# Patient Record
Sex: Female | Born: 1965 | Race: Black or African American | Hispanic: No | Marital: Married | Smoking: Current every day smoker
Health system: Southern US, Community
[De-identification: ages and names within clinical notes are randomized; demographics above are authoritative.]

## PROBLEM LIST (undated history)

## (undated) DIAGNOSIS — Z9181 History of falling: Secondary | ICD-10-CM

## (undated) DIAGNOSIS — F17210 Nicotine dependence, cigarettes, uncomplicated: Secondary | ICD-10-CM

## (undated) DIAGNOSIS — E785 Hyperlipidemia, unspecified: Secondary | ICD-10-CM

## (undated) DIAGNOSIS — M25561 Pain in right knee: Secondary | ICD-10-CM

## (undated) DIAGNOSIS — E119 Type 2 diabetes mellitus without complications: Secondary | ICD-10-CM

## (undated) DIAGNOSIS — I2699 Other pulmonary embolism without acute cor pulmonale: Secondary | ICD-10-CM

## (undated) DIAGNOSIS — K219 Gastro-esophageal reflux disease without esophagitis: Secondary | ICD-10-CM

## (undated) DIAGNOSIS — I152 Hypertension secondary to endocrine disorders: Secondary | ICD-10-CM

## (undated) DIAGNOSIS — I10 Essential (primary) hypertension: Secondary | ICD-10-CM

## (undated) DIAGNOSIS — Z720 Tobacco use: Secondary | ICD-10-CM

## (undated) DIAGNOSIS — E1159 Type 2 diabetes mellitus with other circulatory complications: Secondary | ICD-10-CM

## (undated) HISTORY — DX: Nicotine dependence, cigarettes, uncomplicated: F17.210

## (undated) HISTORY — DX: History of falling: Z91.81

## (undated) HISTORY — DX: Gastro-esophageal reflux disease without esophagitis: K21.9

## (undated) HISTORY — DX: Hypertension secondary to endocrine disorders: I15.2

## (undated) HISTORY — DX: Hyperlipidemia, unspecified: E78.5

## (undated) HISTORY — DX: Type 2 diabetes mellitus with other circulatory complications: E11.59

## (undated) HISTORY — DX: Type 2 diabetes mellitus without complications: E11.9

## (undated) HISTORY — DX: Tobacco use: Z72.0

## (undated) HISTORY — DX: Other pulmonary embolism without acute cor pulmonale: I26.99

## (undated) HISTORY — DX: Pain in right knee: M25.561

## (undated) HISTORY — DX: Essential (primary) hypertension: I10

---

## 2014-11-23 ENCOUNTER — Other Ambulatory Visit: Payer: Self-pay | Admitting: Family Medicine

## 2014-11-23 DIAGNOSIS — R928 Other abnormal and inconclusive findings on diagnostic imaging of breast: Secondary | ICD-10-CM

## 2014-11-29 ENCOUNTER — Other Ambulatory Visit: Payer: Self-pay

## 2014-12-05 ENCOUNTER — Inpatient Hospital Stay: Admission: RE | Admit: 2014-12-05 | Payer: Self-pay | Source: Ambulatory Visit

## 2015-04-12 ENCOUNTER — Other Ambulatory Visit: Payer: Self-pay | Admitting: Family Medicine

## 2015-04-12 ENCOUNTER — Ambulatory Visit
Admission: RE | Admit: 2015-04-12 | Discharge: 2015-04-12 | Disposition: A | Discharge status: Home / Self Care | Payer: BLUE CROSS/BLUE SHIELD | Source: Ambulatory Visit | Attending: Family Medicine | Admitting: Family Medicine

## 2015-04-12 DIAGNOSIS — R928 Other abnormal and inconclusive findings on diagnostic imaging of breast: Secondary | ICD-10-CM

## 2016-09-22 ENCOUNTER — Encounter (INDEPENDENT_AMBULATORY_CARE_PROVIDER_SITE_OTHER): Payer: Self-pay | Admitting: *Deleted

## 2020-04-25 ENCOUNTER — Other Ambulatory Visit: Payer: Self-pay

## 2020-04-25 ENCOUNTER — Encounter: Payer: Self-pay | Admitting: Orthopaedic Surgery

## 2020-04-25 ENCOUNTER — Ambulatory Visit: Payer: 59 | Admitting: Orthopaedic Surgery

## 2020-04-25 VITALS — BP 211/117 | HR 120 | Wt 163.0 lb

## 2020-04-25 DIAGNOSIS — G8929 Other chronic pain: Secondary | ICD-10-CM | POA: Diagnosis not present

## 2020-04-25 DIAGNOSIS — M25562 Pain in left knee: Secondary | ICD-10-CM

## 2020-04-25 NOTE — Progress Notes (Signed)
Subjective:    Patient ID: Anne May, female    DOB: May 30, 1966, 54 y.o.   MRN: [id number]  HPI She had an injury to her left knee about two to two and a half years ago when her son ran into her playing.  She has had medial knee pain since then.  She has swelling, popping and giving way.  She was seen at [facility] 01-02-2020 for the knee.  She has had x-rays and injection.  She was seen at [facility] on 02-02-2020 for her knee and had aspiration and injection as well as x-rays.  She was seen at [facility] in [place] on 03-19-2020 and evaluated for annual physical.  She was referred her from that visit.   She has pain and swelling of the left knee still.  Giving way and popping.  She has no new trauma.  She says the injections only helped a little.  She is tired of the knee hurting, giving way and nothing done to fix it.  I am concerned about a medial meniscus tear and possible need for arthroscopy.  She needs a MRI.  I will arrange this.  For Review of Systems, all other systems reviewed and are negative.  The following is a summary of the past history medically, past history surgically, known current medicines, social history and family history.  This information is gathered electronically by the computer from prior information and documentation.  I review this each visit and have found including this information at this point in the chart is beneficial and informative.   No past medical history on file.    Current Outpatient Medications on File Prior to Visit  Medication Sig Dispense Refill  . atorvastatin (LIPITOR) 20 MG tablet Take 20 mg by mouth daily.    . diphenhydrAMINE (BENADRYL) 25 mg capsule Take by mouth.    . famotidine (PEPCID) 20 MG tablet Take by mouth.    . hydrochlorothiazide (HYDRODIURIL) 25 MG tablet Take 25 mg by mouth daily.    Marland Kitchen ibuprofen (ADVIL) 800 MG tablet Take 800 mg by mouth every 8 (eight) hours as needed.    Marland Kitchen lisinopril (ZESTRIL) 40 MG tablet  Take 40 mg by mouth daily.    . metFORMIN (GLUCOPHAGE) 1000 MG tablet Take 1,000 mg by mouth 2 (two) times daily.     No current facility-administered medications on file prior to visit.    Social History   Socioeconomic History  . Marital status: Married    Spouse name: Not on file  . Number of children: Not on file  . Years of education: Not on file  . Highest education level: Not on file  Occupational History  . Not on file  Tobacco Use  . Smoking status: Current Every Day Smoker  . Smokeless tobacco: Never Used  Substance and Sexual Activity  . Alcohol use: Not Currently  . Drug use: Never  . Sexual activity: Not on file  Other Topics Concern  . Not on file  Social History Narrative  . Not on file   Social Determinants of Health   Financial Resource Strain:   . Difficulty of Paying Living Expenses: Not on file  Food Insecurity:   . Worried About Programme researcher, broadcasting/film/video in the Last Year: Not on file  . Ran Out of Food in the Last Year: Not on file  Transportation Needs:   . Lack of Transportation (Medical): Not on file  . Lack of Transportation (Non-Medical): Not on file  Physical Activity:   . Days of Exercise per Week: Not on file  . Minutes of Exercise per Session: Not on file  Stress:   . Feeling of Stress : Not on file  Social Connections:   . Frequency of Communication with Friends and Family: Not on file  . Frequency of Social Gatherings with Friends and Family: Not on file  . Attends Religious Services: Not on file  . Active Member of Clubs or Organizations: Not on file  . Attends Banker Meetings: Not on file  . Marital Status: Not on file  Intimate Partner Violence:   . Fear of Current or Ex-Partner: Not on file  . Emotionally Abused: Not on file  . Physically Abused: Not on file  . Sexually Abused: Not on file    Family history of heart disease.  BP (!) 211/117   Pulse (!) 120   Wt 163 lb (73.9 kg)   There is no height or weight on  file to calculate BMI.   Review of Systems     Objective:   Physical Exam Vitals and nursing note reviewed.  Constitutional:      Appearance: She is well-developed.  HENT:     Head: Normocephalic and atraumatic.  Eyes:     Conjunctiva/sclera: Conjunctivae normal.     Pupils: Pupils are equal, round, and reactive to light.  Cardiovascular:     Rate and Rhythm: Normal rate and regular rhythm.  Pulmonary:     Effort: Pulmonary effort is normal.  Abdominal:     Palpations: Abdomen is soft.  Musculoskeletal:     Cervical back: Normal range of motion and neck supple.       Legs:  Skin:    General: Skin is warm and dry.  Neurological:     Mental Status: She is alert and oriented to person, place, and time.     Cranial Nerves: No cranial nerve deficit.     Motor: No abnormal muscle tone.     Coordination: Coordination normal.     Deep Tendon Reflexes: Reflexes are normal and symmetric. Reflexes normal.  Psychiatric:        Behavior: Behavior normal.        Thought Content: Thought content normal.        Judgment: Judgment normal.    I have reviewed prior notes mentioned above.  I do not have the actual x-rays to review today but the reports are present.      Assessment & Plan:   Encounter Diagnosis  Name Primary?  . Chronic pain of left knee Yes   I will get MRI of the left knee.  Return after MRI.  Call if any problem.  Precautions discussed.   Electronically Signed Darreld Mclean, MD 9/8/20218:54 AM

## 2020-04-26 ENCOUNTER — Ambulatory Visit: Payer: 59 | Admitting: Orthopaedic Surgery

## 2020-04-27 ENCOUNTER — Telehealth: Payer: Self-pay | Admitting: Orthopaedic Surgery

## 2020-04-27 NOTE — Telephone Encounter (Signed)
Called patient to R/S her appt with Dr. Hilda Lias on 05/08/20.  Her office visit is before the MRI test on 05/09/20.    Need to R/S her office visit if she calls back

## 2020-05-07 NOTE — Telephone Encounter (Signed)
Patient has rescheduled her appointment for a later date.

## 2020-05-08 ENCOUNTER — Ambulatory Visit: Payer: 59 | Admitting: Orthopaedic Surgery

## 2020-05-09 ENCOUNTER — Other Ambulatory Visit: Payer: Self-pay

## 2020-05-09 ENCOUNTER — Ambulatory Visit (HOSPITAL_COMMUNITY)
Admission: RE | Admit: 2020-05-09 | Discharge: 2020-05-09 | Disposition: A | Discharge status: Home / Self Care | Payer: 59 | Source: Ambulatory Visit | Attending: Orthopaedic Surgery | Admitting: Orthopaedic Surgery

## 2020-05-09 DIAGNOSIS — M25562 Pain in left knee: Secondary | ICD-10-CM | POA: Diagnosis present

## 2020-05-09 DIAGNOSIS — G8929 Other chronic pain: Secondary | ICD-10-CM

## 2020-05-09 IMAGING — MR MR KNEE*L* W/O CM
7 series · 40 of 40 positions shown · non-contrast
Comparison: Radiographs [DATE]

CLINICAL DATA: Chronic left knee pain for 6 months. No known
injury.

EXAM:
MRI OF THE LEFT KNEE WITHOUT CONTRAST
TECHNIQUE: Multiplanar, multisequence MR imaging of the knee was performed. No
intravenous contrast was administered.

[Series 8: T2 fat-sat · axial · left · 4.0mm · 0.47mm/px · z∈[-60,+65]mm · 7 of 26 slices shown (1 of 3)]
[im 1/26]
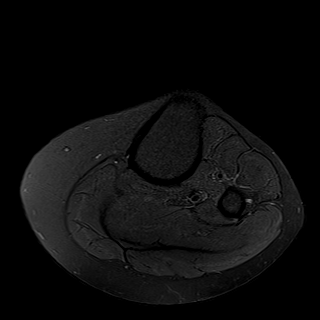
[im 5/26]
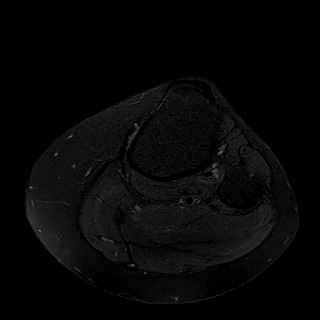
[im 9/26]
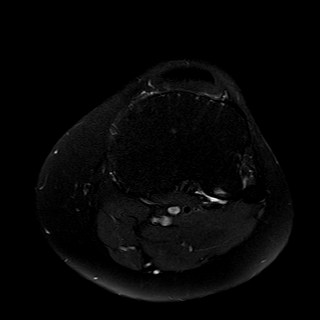
[im 13/26]
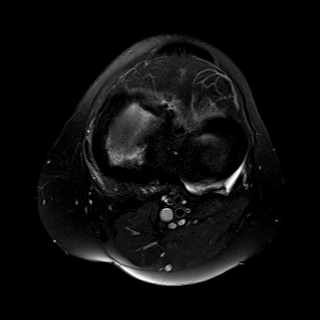
[im 17/26]
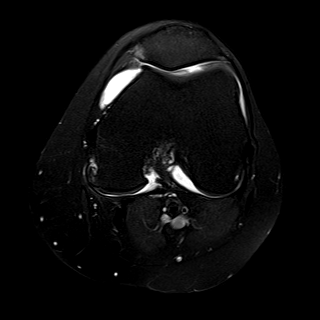
[im 21/26]
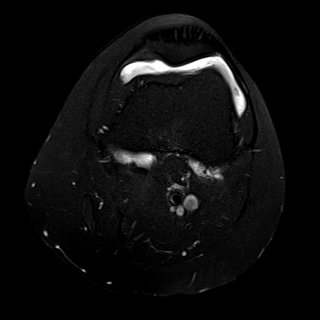
[im 26/26]
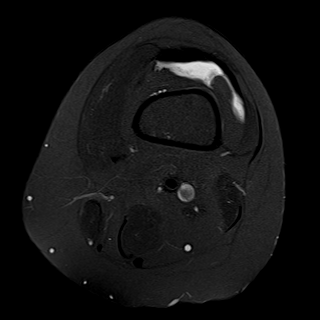

[Series 9: T1 · coronal · left · 4.0mm · 0.59mm/px · 5 of 24 slices shown]
[im 1/24]
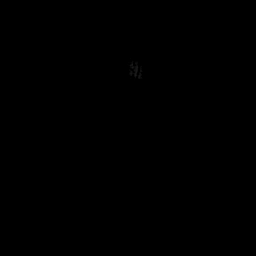
[im 6/24]
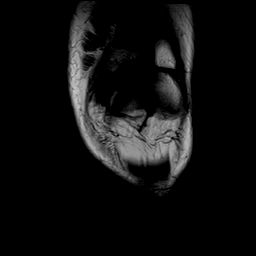
[im 12/24]
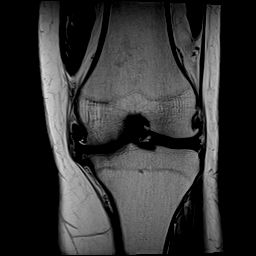
[im 18/24]
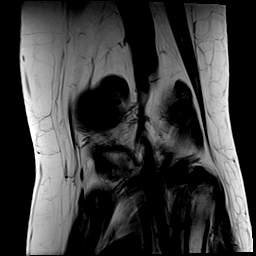
[im 24/24]
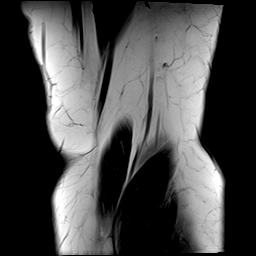

[Series 10: T2 fat-sat · coronal · left · 4.0mm · 0.59mm/px · 5 of 24 slices shown (2 of 3)]
[im 1/24]
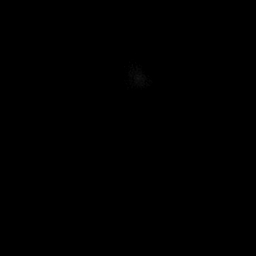
[im 6/24]
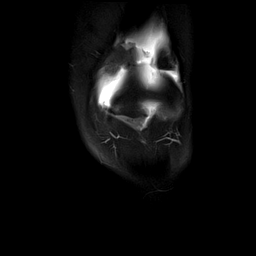
[im 12/24]
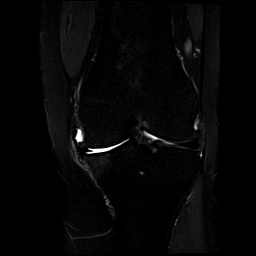
[im 18/24]
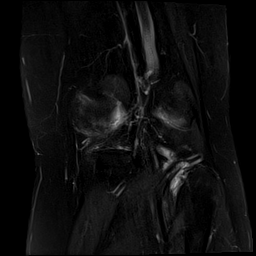
[im 24/24]
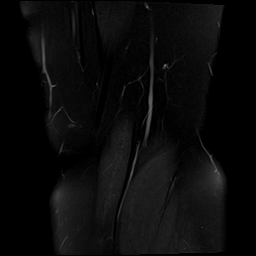

[Series 11: PD fat-sat · coronal · left · 3.0mm · 0.59mm/px · 7 of 30 slices shown (1 of 2)]
[im 1/30]
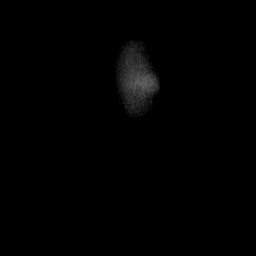
[im 5/30]
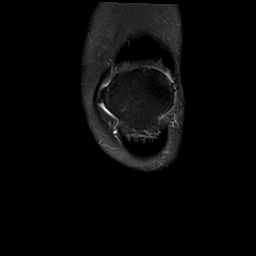
[im 10/30]
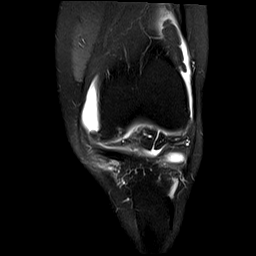
[im 15/30]
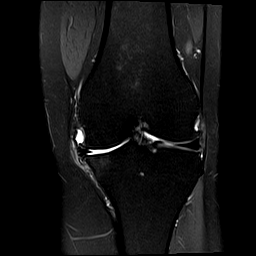
[im 20/30]
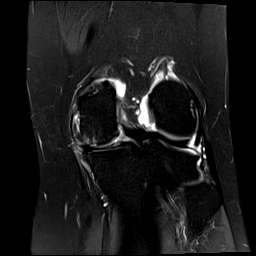
[im 25/30]
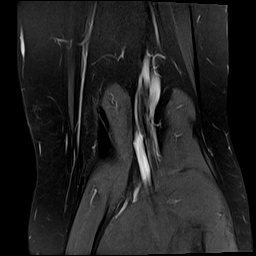
[im 30/30]
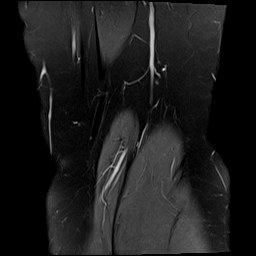

[Series 12: PD fat-sat · sagittal · left · 3.0mm · 0.59mm/px · 6 of 28 slices shown (2 of 2)]
[im 1/28]
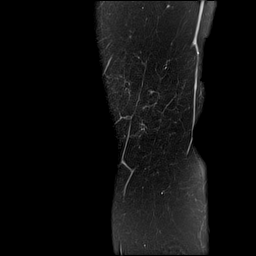
[im 6/28]
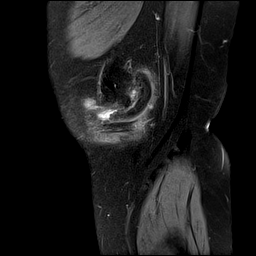
[im 11/28]
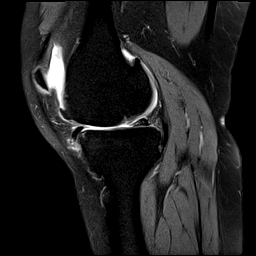
[im 17/28]
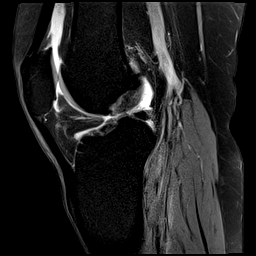
[im 22/28]
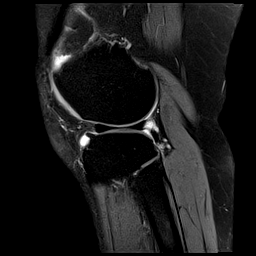
[im 28/28]
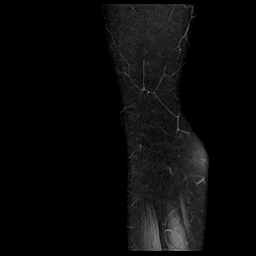

[Series 13: T2 fat-sat · sagittal · left · 3.0mm · 0.59mm/px · 6 of 28 slices shown (3 of 3)]
[im 1/28]
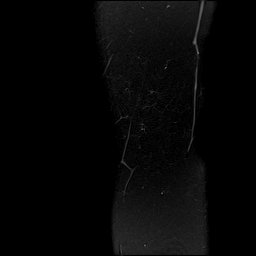
[im 6/28]
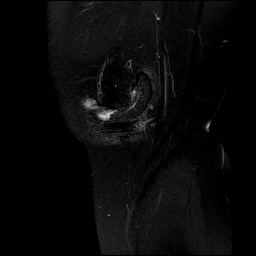
[im 11/28]
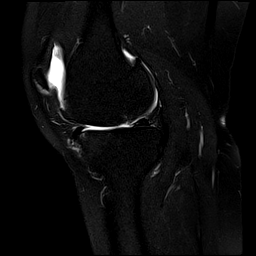
[im 17/28]
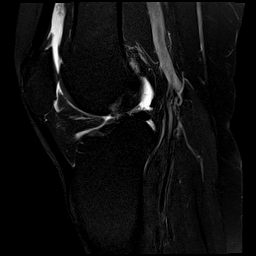
[im 22/28]
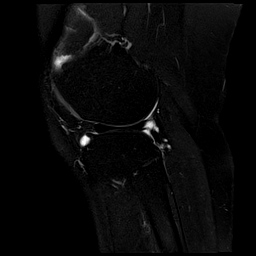
[im 28/28]
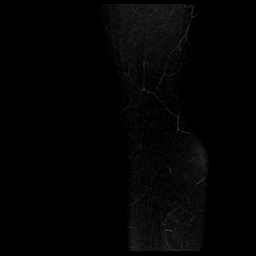

[Series 14: PD · coronal · left · 2.0mm · 0.47mm/px · 4 of 20 slices shown]
[im 1/20]
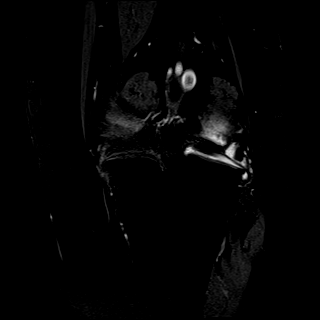
[im 7/20]
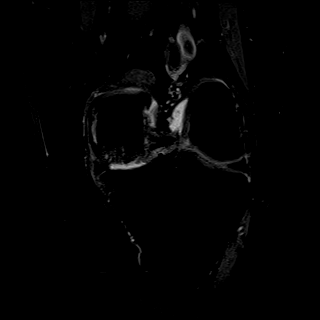
[im 13/20]
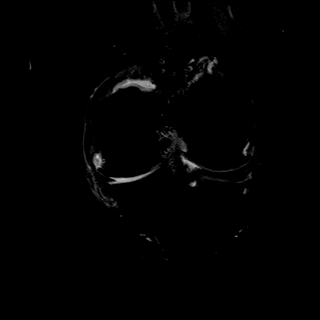
[im 20/20]
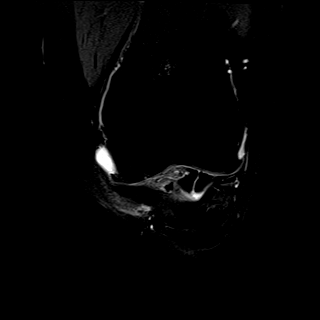

[40 of 40 positions shown; findings below may reference images not displayed]

FINDINGS: MENISCI

Medial meniscus: Diffusely degenerated with mild free edge fraying
and mild peripheral extrusion from the joint. The meniscal root is
intact. No discrete meniscal tear or displaced meniscal fragment.

Lateral meniscus:  Intact with normal morphology.

LIGAMENTS

Cruciates:  Intact.

Collaterals: Intact. Degenerative buckling of the medial collateral
ligament.

CARTILAGE

Patellofemoral: Mild diffuse chondral thinning and osteophyte
formation. Small subchondral cysts in the trochlea.

Medial: Advanced osteoarthritis with diffuse chondral thinning,
osteophytes and subchondral eburnation.

Lateral: Mild chondral thinning and peripheral osteophyte formation.

MISCELLANEOUS

Joint: Moderate-sized joint effusion. No evidence of intra-articular
loose body.

Popliteal Fossa:  Unremarkable. No significant Baker's cyst.

Extensor Mechanism:  Intact.

Bones:  No acute or significant extra-articular osseous findings.

Other: No other significant periarticular soft tissue findings.
IMPRESSION: 1. Tricompartmental osteoarthritis, most advanced in the medial
compartment. No acute osseous findings.
2. Moderate-sized joint effusion.
3. Degeneration and free edge fraying of the medial meniscus without
discrete tear.
4. The lateral meniscus, cruciate and collateral ligaments are
intact.

## 2020-05-10 ENCOUNTER — Encounter: Payer: Self-pay | Admitting: Orthopaedic Surgery

## 2020-05-10 ENCOUNTER — Telehealth: Payer: Self-pay | Admitting: Orthopaedic Surgery

## 2020-05-10 ENCOUNTER — Ambulatory Visit: Payer: 59 | Admitting: Orthopaedic Surgery

## 2020-05-10 VITALS — Ht 66.5 in | Wt 179.0 lb

## 2020-05-10 DIAGNOSIS — M25562 Pain in left knee: Secondary | ICD-10-CM | POA: Diagnosis not present

## 2020-05-10 DIAGNOSIS — G8929 Other chronic pain: Secondary | ICD-10-CM | POA: Diagnosis not present

## 2020-05-10 NOTE — Telephone Encounter (Signed)
Please call and verify left knee injections for Visco supplementation.

## 2020-05-10 NOTE — Progress Notes (Signed)
What did the MRI show?  She had the MRI of the left knee.  It showed: IMPRESSION: 1. 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 No acute osseous findings. 2. Moderate-sized joint effusion. 3. Degeneration and free edge fraying of the medial meniscus without discrete tear. 4. The lateral meniscus, cruciate and collateral ligaments are Intact.  I explained the findings to her.  I have independently reviewed the MRI.     She became very emotional.  I told her that she did not need surgery.  She has had problems with the knee for about three years.  She has had injections, medicine, exercises and still hurts.  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  I spent over 30 minutes with her answering her questions.  I will arrange for viscosupplementation for the knee.  Return in two weeks.  Call if any problem.  Precautions discussed.   Electronically Signed Darreld Mclean, MD 9/23/202111:18 AM

## 2020-05-11 NOTE — Telephone Encounter (Signed)
[employer] ins can do Orthovisc, Synvisc or Synvisc One.  Orthovisc and Synvisc are multiple injections (3) and Synvisc One is the one shot.   Let me know which patient would rather try and I can check benefits and do the auth for it.

## 2020-05-11 NOTE — Telephone Encounter (Signed)
Can you verify which visco supplementation is covered by this insurance? Please advise.

## 2020-05-14 NOTE — Telephone Encounter (Signed)
Patient wants to try for the Orthovisc. If that is not covered then she will try for the Synvisc. Please advise.

## 2020-05-15 NOTE — Telephone Encounter (Signed)
Orthovisc authorized, auth # [id number], valid 05/14/2020 thru 05/13/2021.  Insurance will cover at 100% since deductible is met.    Ok to call patient and schedule her for Orthovisc injections, left knee, 3 visits one week apart.  Angie, can you please call and schedule?    Thanks-

## 2020-05-16 NOTE — Telephone Encounter (Signed)
Patient has an appointment scheduled.  °

## 2020-05-24 ENCOUNTER — Other Ambulatory Visit: Payer: Self-pay

## 2020-05-24 ENCOUNTER — Encounter: Payer: Self-pay | Admitting: Orthopaedic Surgery

## 2020-05-24 ENCOUNTER — Ambulatory Visit (INDEPENDENT_AMBULATORY_CARE_PROVIDER_SITE_OTHER): Payer: 59 | Admitting: Orthopaedic Surgery

## 2020-05-24 DIAGNOSIS — G8929 Other chronic pain: Secondary | ICD-10-CM

## 2020-05-24 DIAGNOSIS — M1712 Unilateral primary osteoarthritis, left knee: Secondary | ICD-10-CM | POA: Diagnosis not present

## 2020-05-24 NOTE — Progress Notes (Addendum)
This patient is diagnosed with osteoarthritis of the knee(s).    Radiographs show evidence of joint space narrowing, osteophytes, subchondral sclerosis and/or subchondral cysts.  This patient has knee pain which interferes with functional and activities of daily living.    This patient has experienced inadequate response, adverse effects and/or intolerance with conservative treatments such as acetaminophen, NSAIDS, topical creams, physical therapy or regular exercise, knee bracing and/or weight loss.   This patient has experienced inadequate response or has a contraindication to intra articular steroid injections for at least 3 months.   This patient is not scheduled to have a total knee replacement within 6 months of starting treatment with viscosupplementation.  PROCEDURE NOTE:  Orthovisc injection #  1 of 3   Injection 1 vial of Orthovisc into left  knee  There were no vitals taken for this visit.  Thel eft knee exam: there was no synovitis or infection   The knee was prepped sterilely  Ethyl chloride was used to anesthetize the skin A 20 g needle was used to inject the knee with 1 vial of Orthovisc A sterile dressing was placed  There were no complications  Encounter Diagnosis  Name Primary?   Chronic pain of left knee Yes    Follow up one week  Electronically Signed Darreld Mclean, MD 10/7/20219:32 AM

## 2020-05-31 ENCOUNTER — Ambulatory Visit (INDEPENDENT_AMBULATORY_CARE_PROVIDER_SITE_OTHER): Payer: 59 | Admitting: Orthopaedic Surgery

## 2020-05-31 ENCOUNTER — Encounter: Payer: Self-pay | Admitting: Orthopaedic Surgery

## 2020-05-31 ENCOUNTER — Other Ambulatory Visit: Payer: Self-pay

## 2020-05-31 VITALS — Ht 66.5 in | Wt 179.0 lb

## 2020-05-31 DIAGNOSIS — G8929 Other chronic pain: Secondary | ICD-10-CM

## 2020-05-31 DIAGNOSIS — M1712 Unilateral primary osteoarthritis, left knee: Secondary | ICD-10-CM

## 2020-05-31 NOTE — Progress Notes (Signed)
This patient is diagnosed with osteoarthritis of the knee(s).    Radiographs show evidence of joint space narrowing, osteophytes, subchondral sclerosis and/or subchondral cysts.  This patient has knee pain which interferes with functional and activities of daily living.    This patient has experienced inadequate response, adverse effects and/or intolerance with conservative treatments such as acetaminophen, NSAIDS, topical creams, physical therapy or regular exercise, knee bracing and/or weight loss.   This patient has experienced inadequate response or has a contraindication to intra articular steroid injections for at least 3 months.   This patient is not scheduled to have a total knee replacement within 6 months of starting treatment with viscosupplementation.  PROCEDURE NOTE:  Orthovisc injection #  2 of 3   Injection 1 vial of Orthovisc into left  knee  Ht 5' 6.5" (1.689 m)   Wt 179 lb (81.2 kg)   BMI 28.46 kg/m   The left knee exam: there was no synovitis or infection   The knee was prepped sterilely  Ethyl chloride was used to anesthetize the skin A 20 g needle was used to inject the knee with 1 vial of Orthovisc A sterile dressing was placed  There were no complications  Encounter Diagnosis  Name Primary?  . Chronic pain of left knee Yes     Follow up one week  Electronically Signed Darreld Mclean, MD 10/14/20219:18 AM

## 2020-06-07 ENCOUNTER — Ambulatory Visit (INDEPENDENT_AMBULATORY_CARE_PROVIDER_SITE_OTHER): Payer: 59 | Admitting: Orthopaedic Surgery

## 2020-06-07 ENCOUNTER — Other Ambulatory Visit: Payer: Self-pay

## 2020-06-07 ENCOUNTER — Encounter: Payer: Self-pay | Admitting: Orthopaedic Surgery

## 2020-06-07 DIAGNOSIS — M1712 Unilateral primary osteoarthritis, left knee: Secondary | ICD-10-CM | POA: Diagnosis not present

## 2020-06-07 DIAGNOSIS — G8929 Other chronic pain: Secondary | ICD-10-CM

## 2020-06-07 DIAGNOSIS — M25562 Pain in left knee: Secondary | ICD-10-CM

## 2020-06-07 NOTE — Progress Notes (Signed)
This patient is diagnosed with osteoarthritis of the knee(s).    Radiographs show evidence of joint space narrowing, osteophytes, subchondral sclerosis and/or subchondral cysts.  This patient has knee pain which interferes with functional and activities of daily living.    This patient has experienced inadequate response, adverse effects and/or intolerance with conservative treatments such as acetaminophen, NSAIDS, topical creams, physical therapy or regular exercise, knee bracing and/or weight loss.   This patient has experienced inadequate response or has a contraindication to intra articular steroid injections for at least 3 months.   This patient is not scheduled to have a total knee replacement within 6 months of starting treatment with viscosupplementation.  PROCEDURE NOTE:  Orthovisc injection #  3 of 3   Injection 1 vial of Orthovisc into left  knee  There were no vitals taken for this visit.  The left knee exam: there was no synovitis or infection   The knee was prepped sterilely  Ethyl chloride was used to anesthetize the skin A 20 g needle was used to inject the knee with 1 vial of Orthovisc A sterile dressing was placed  There were no complications  Encounter Diagnosis  Name Primary?  . Chronic pain of left knee Yes    Follow up one month  Electronically Signed Darreld Mclean, MD 10/21/20211:38 PM

## 2020-07-05 ENCOUNTER — Other Ambulatory Visit: Payer: Self-pay

## 2020-07-05 ENCOUNTER — Ambulatory Visit: Payer: 59 | Admitting: Orthopaedic Surgery

## 2020-07-05 ENCOUNTER — Encounter: Payer: Self-pay | Admitting: Orthopaedic Surgery

## 2020-07-05 VITALS — BP 167/86 | HR 116 | Ht 66.5 in | Wt 172.0 lb

## 2020-07-05 DIAGNOSIS — G8929 Other chronic pain: Secondary | ICD-10-CM | POA: Diagnosis not present

## 2020-07-05 DIAGNOSIS — E1065 Type 1 diabetes mellitus with hyperglycemia: Secondary | ICD-10-CM | POA: Diagnosis not present

## 2020-07-05 DIAGNOSIS — M25562 Pain in left knee: Secondary | ICD-10-CM | POA: Diagnosis not present

## 2020-07-05 NOTE — Progress Notes (Signed)
She has left knee pain. She has had more pain.  She went to [facility] Saturday with blood sugars over 600.  A1C was over 15.  She was treated with IV and insulin. Her sugars were about 230 when she left.  She is not managing her diabetes well at all.  I have spent 30 minutes talking to her and her husband about this.  She needs to see her family doctor, needs to see her ophthalmology doctor, needs to see nutritionist, etc.  Her knee does hurt and she has significant DJD but no surgery can be done if her diabetes is not in control.  I have emphasized to her and her husband how dangerous this sugar level is and that she needs to focus all her energy on controlling it.  Thanksgiving is next week and she is obviously not eating well.  She is to go to her family doctor's office after leaving here.  I will see her in one month.  Encounter Diagnoses  Name Primary?  . Chronic pain of left knee Yes  . Uncontrolled type 1 diabetes mellitus with hyperglycemia (HCC)    I have again stressed the urgency of controlling her blood sugars.  Call if any problem.  Precautions discussed.   Electronically Signed Darreld Mclean, MD 11/18/20219:21 AM

## 2020-07-05 NOTE — Patient Instructions (Addendum)

## 2020-07-31 ENCOUNTER — Ambulatory Visit: Payer: 59 | Admitting: Orthopaedic Surgery

## 2020-08-02 ENCOUNTER — Ambulatory Visit: Payer: 59 | Admitting: Orthopaedic Surgery

## 2020-08-30 ENCOUNTER — Ambulatory Visit: Payer: 59 | Admitting: Orthopaedic Surgery

## 2020-10-09 ENCOUNTER — Encounter: Payer: Self-pay | Admitting: Orthopaedic Surgery

## 2020-10-09 ENCOUNTER — Ambulatory Visit: Payer: 59 | Admitting: Orthopaedic Surgery

## 2020-10-09 ENCOUNTER — Other Ambulatory Visit: Payer: Self-pay

## 2020-10-09 VITALS — BP 179/99 | HR 102 | Ht 65.5 in | Wt 179.0 lb

## 2020-10-09 DIAGNOSIS — E1065 Type 1 diabetes mellitus with hyperglycemia: Secondary | ICD-10-CM

## 2020-10-09 DIAGNOSIS — M25562 Pain in left knee: Secondary | ICD-10-CM | POA: Diagnosis not present

## 2020-10-09 DIAGNOSIS — G8929 Other chronic pain: Secondary | ICD-10-CM | POA: Diagnosis not present

## 2020-10-09 NOTE — Progress Notes (Signed)
I need a handicap sticker  She is not controlling her diabetes.  She is to see her primary care March 1.  She wants a handicap sticker.  Her left knee is tender, has slight effusion but she does not want injection or treatment today.  Encounter Diagnoses  Name Primary?  . Chronic pain of left knee Yes  . Uncontrolled type 1 diabetes mellitus with hyperglycemia (HCC)    Return in two weeks.  I have given signed application for handicap permit sticker.  Call if any problem.  Precautions discussed.   Electronically Signed Darreld Mclean, MD 2/22/20229:18 AM

## 2020-10-23 ENCOUNTER — Encounter: Payer: 59 | Admitting: Orthopaedic Surgery

## 2020-10-23 ENCOUNTER — Other Ambulatory Visit: Payer: Self-pay

## 2020-10-23 ENCOUNTER — Encounter: Payer: Self-pay | Admitting: Orthopaedic Surgery

## 2020-10-23 DIAGNOSIS — E785 Hyperlipidemia, unspecified: Secondary | ICD-10-CM | POA: Insufficient documentation

## 2020-10-23 DIAGNOSIS — E119 Type 2 diabetes mellitus without complications: Secondary | ICD-10-CM | POA: Insufficient documentation

## 2020-10-23 DIAGNOSIS — F17200 Nicotine dependence, unspecified, uncomplicated: Secondary | ICD-10-CM | POA: Insufficient documentation

## 2020-10-23 DIAGNOSIS — Z9181 History of falling: Secondary | ICD-10-CM | POA: Insufficient documentation

## 2020-10-23 DIAGNOSIS — K219 Gastro-esophageal reflux disease without esophagitis: Secondary | ICD-10-CM | POA: Insufficient documentation

## 2020-10-23 DIAGNOSIS — I10 Essential (primary) hypertension: Secondary | ICD-10-CM | POA: Insufficient documentation

## 2020-10-30 ENCOUNTER — Ambulatory Visit: Payer: 59 | Admitting: Orthopaedic Surgery

## 2020-10-30 ENCOUNTER — Other Ambulatory Visit: Payer: Self-pay

## 2020-10-30 ENCOUNTER — Ambulatory Visit: Payer: 59

## 2020-10-30 ENCOUNTER — Encounter: Payer: Self-pay | Admitting: Orthopaedic Surgery

## 2020-10-30 VITALS — BP 182/103 | HR 112 | Ht 65.5 in | Wt 174.0 lb

## 2020-10-30 DIAGNOSIS — G8929 Other chronic pain: Secondary | ICD-10-CM

## 2020-10-30 DIAGNOSIS — M25562 Pain in left knee: Secondary | ICD-10-CM

## 2020-10-30 NOTE — Progress Notes (Signed)
Patient Anne May, female DOB:Feb 19, 1966, 55 y.o. OFB:[id number]  Chief Complaint  Patient presents with  . Knee Pain    left    HPI  Anne May is a 55 y.o. female who has continued pain of the left knee with swelling and giving way.  She is not improved at all.  I have been seeing her since October 2021 for this problem  She has been seeing her diabetes doctor also but her diabetes is still uncontrolled by what I can find from talking to her.  She is working on it.  She does not know her last A1C or her last blood sugar.   Body mass index is 28.51 kg/m.  ROS  Review of Systems  Constitutional: Positive for activity change.  Endocrine:       Uncontrolled diabetes.  Musculoskeletal: Positive for arthralgias, gait problem and joint swelling.  All other systems reviewed and are negative.   All other systems reviewed and are negative.  The following is a summary of the past history medically, past history surgically, known current medicines, social history and family history.  This information is gathered electronically by the computer from prior information and documentation.  I review this each visit and have found including this information at this point in the chart is beneficial and informative.    History reviewed. No pertinent past medical history.  History reviewed. No pertinent surgical history.  Family History  Family history unknown: Yes    Social History Social History   Tobacco Use  . Smoking status: Current Every Day Smoker  . Smokeless tobacco: Never Used  Substance Use Topics  . Alcohol use: Not Currently  . Drug use: Never    No Known Allergies  Current Outpatient Medications  Medication Sig Dispense Refill  . amLODipine (NORVASC) 5 MG tablet Take by mouth.    Marland Kitchen atorvastatin (LIPITOR) 20 MG tablet Take 20 mg by mouth daily.    . diphenhydrAMINE (BENADRYL) 25 mg capsule Take by mouth.    . famotidine (PEPCID) 20 MG tablet Take by mouth.    Marland Kitchen  glipiZIDE (GLUCOTROL) 10 MG tablet Take 10 mg by mouth daily before breakfast.    . hydrochlorothiazide (HYDRODIURIL) 25 MG tablet Take 25 mg by mouth daily.    Marland Kitchen ibuprofen (ADVIL) 800 MG tablet Take 800 mg by mouth every 8 (eight) hours as needed.    Marland Kitchen lisinopril (ZESTRIL) 40 MG tablet Take 40 mg by mouth daily.    . metFORMIN (GLUCOPHAGE) 1000 MG tablet Take 1,000 mg by mouth 2 (two) times daily.     No current facility-administered medications for this visit.     Physical Exam  Blood pressure (!) 182/103, pulse (!) 112, height 5' 5.5" (1.664 m), weight 174 lb (78.9 kg).  Constitutional: overall normal hygiene, normal nutrition, well developed, normal grooming, normal body habitus. Assistive device:none  Musculoskeletal: gait and station Limp left, muscle tone and strength are normal, no tremors or atrophy is present.  .  Neurological: coordination overall normal.  Deep tendon reflex/nerve stretch intact.  Sensation normal.  Cranial nerves II-XII intact.   Skin:   Normal overall no scars, lesions, ulcers or rashes. No psoriasis.  Psychiatric: Alert and oriented x 3.  Recent memory intact, remote memory unclear.  Normal mood and affect. Well groomed.  Good eye contact.  Cardiovascular: overall no swelling, no varicosities, no edema bilaterally, normal temperatures of the legs and arms, no clubbing, cyanosis and good capillary refill.  Lymphatic: palpation is normal.  Left knee pain, effusion, ROM 0 to 105, tender medially, positive medial McMurray, limp left.  All other systems reviewed and are negative   The patient has been educated about the nature of the problem(s) and counseled on treatment options.  The patient appeared to understand what I have discussed and is in agreement with it.  Encounter Diagnosis  Name Primary?  . Chronic pain of left knee Yes  X-rays were done of the left knee, reported separately.  PLAN Call if any problems.  Precautions discussed.  Continue  current medications.   Return to clinic 2 weeks  Get MRI.   Electronically Signed Darreld Mclean, MD 3/15/202210:02 AM

## 2020-10-31 ENCOUNTER — Telehealth: Payer: Self-pay | Admitting: Orthopaedic Surgery

## 2020-10-31 NOTE — Telephone Encounter (Signed)
Patient called and left voicemail stating she almost fell and she needs a durable knee brace not the ones at [facility].  Please call her back.

## 2020-11-01 NOTE — Telephone Encounter (Signed)
Called and spoke patient. She is coming this afternoon around 2 to be fitted for brace.

## 2020-11-13 ENCOUNTER — Ambulatory Visit: Payer: 59 | Admitting: Orthopaedic Surgery

## 2020-11-20 ENCOUNTER — Ambulatory Visit (HOSPITAL_COMMUNITY)
Admission: RE | Admit: 2020-11-20 | Discharge: 2020-11-20 | Disposition: A | Discharge status: Home / Self Care | Payer: 59 | Source: Ambulatory Visit | Attending: Orthopaedic Surgery | Admitting: Orthopaedic Surgery

## 2020-11-20 DIAGNOSIS — G8929 Other chronic pain: Secondary | ICD-10-CM | POA: Insufficient documentation

## 2020-11-20 DIAGNOSIS — M25562 Pain in left knee: Secondary | ICD-10-CM | POA: Insufficient documentation

## 2020-11-20 IMAGING — MR MR KNEE*L* W/O CM
7 series · 40 of 40 positions shown · non-contrast
Comparison: Radiographs from [DATE]

CLINICAL DATA: Left knee pain following an injury 2 years ago.

EXAM:
MRI OF THE LEFT KNEE WITHOUT CONTRAST
TECHNIQUE: Multiplanar, multisequence MR imaging of the knee was performed. No
intravenous contrast was administered.

[Series 8: T2 fat-sat · axial · left · 4.0mm · 0.47mm/px · z∈[-77,+48]mm · 5 of 26 slices shown (1 of 3)]
[im 1/26]
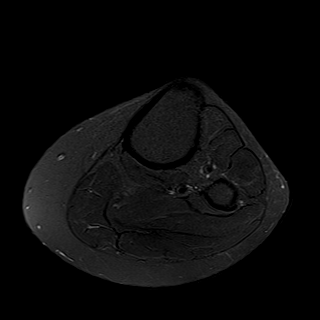
[im 7/26]
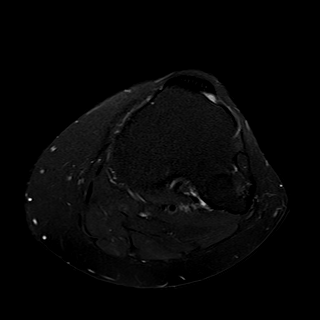
[im 13/26]
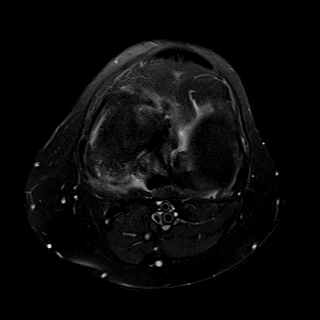
[im 19/26]
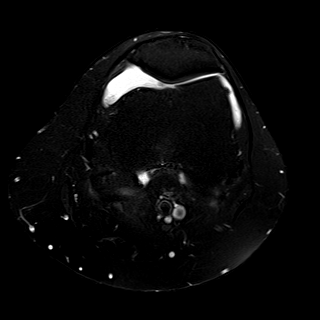
[im 26/26]
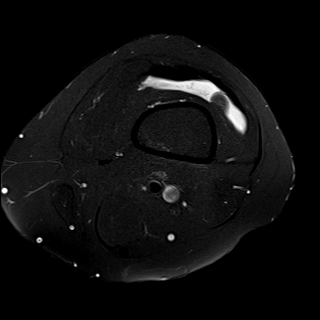

[Series 9: T1 · coronal · left · 4.0mm · 0.59mm/px · 5 of 24 slices shown]
[im 1/24]
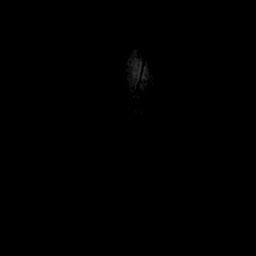
[im 6/24]
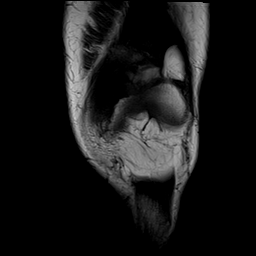
[im 12/24]
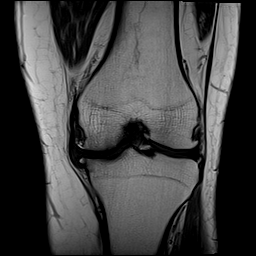
[im 18/24]
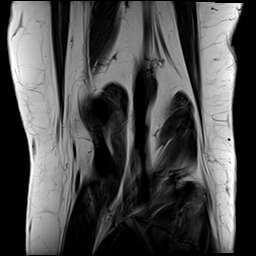
[im 24/24]
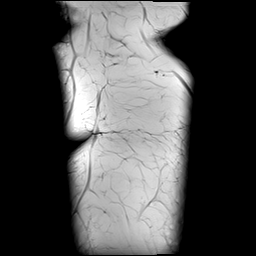

[Series 10: T2 fat-sat · coronal · left · 4.0mm · 0.59mm/px · 7 of 28 slices shown (2 of 3)]
[im 1/28]
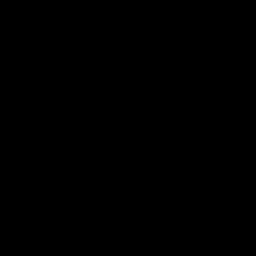
[im 5/28]
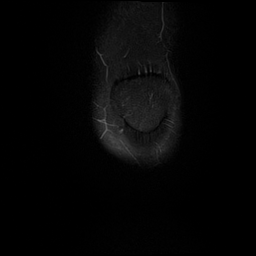
[im 10/28]
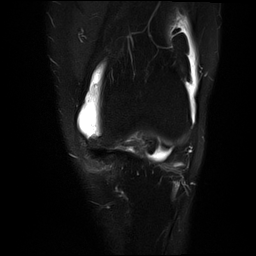
[im 14/28]
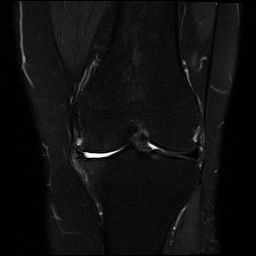
[im 19/28]
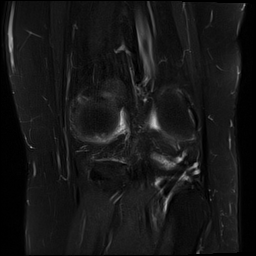
[im 23/28]
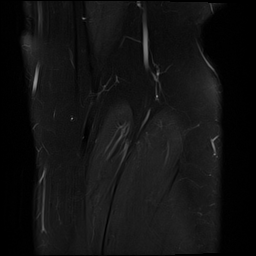
[im 28/28]
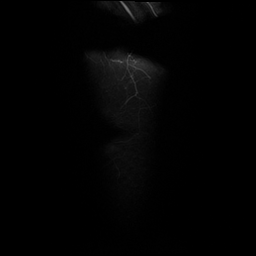

[Series 11: PD fat-sat · coronal · left · 4.0mm · 0.59mm/px · 7 of 28 slices shown (1 of 2)]
[im 1/28]
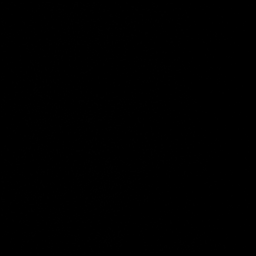
[im 5/28]
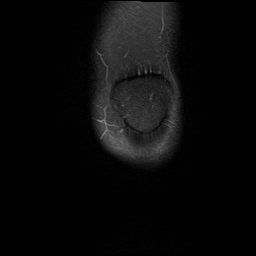
[im 10/28]
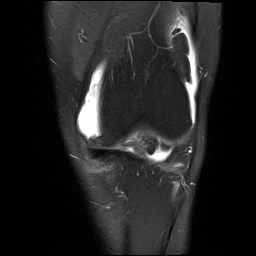
[im 14/28]
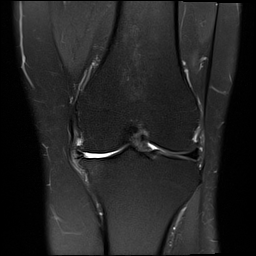
[im 19/28]
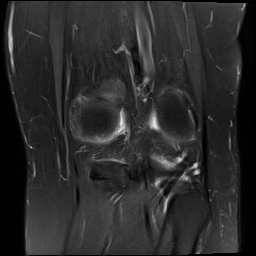
[im 23/28]
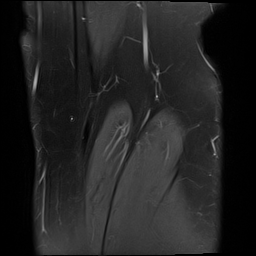
[im 28/28]
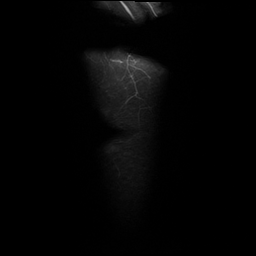

[Series 12: PD fat-sat · sagittal · left · 3.0mm · 0.59mm/px · 7 of 28 slices shown (2 of 2)]
[im 1/28]
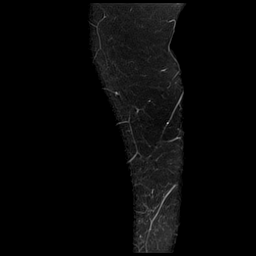
[im 5/28]
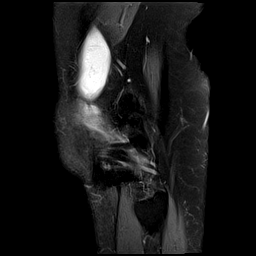
[im 10/28]
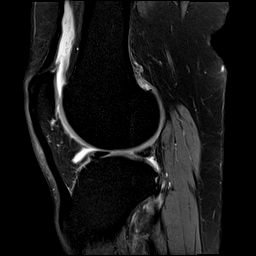
[im 14/28]
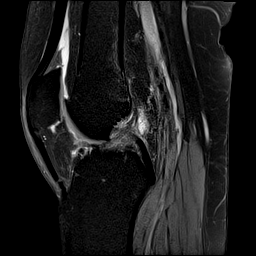
[im 19/28]
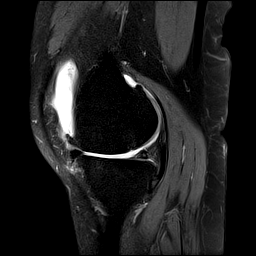
[im 23/28]
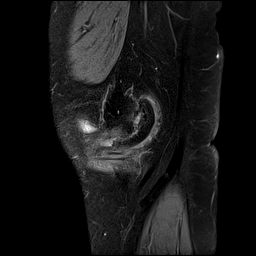
[im 28/28]
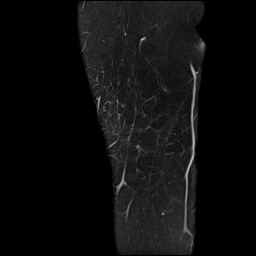

[Series 13: T2 fat-sat · sagittal · left · 3.0mm · 0.59mm/px · 7 of 28 slices shown (3 of 3)]
[im 1/28]
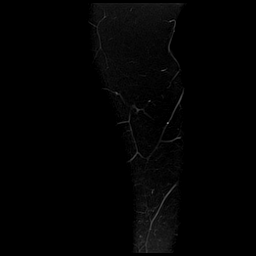
[im 5/28]
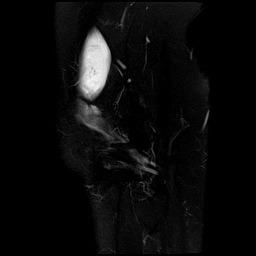
[im 10/28]
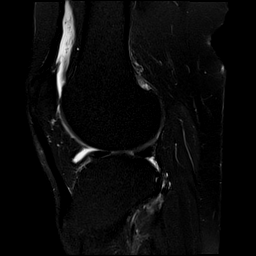
[im 14/28]
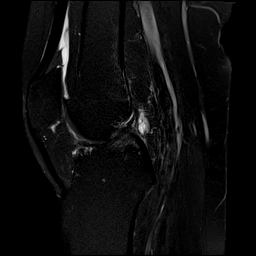
[im 19/28]
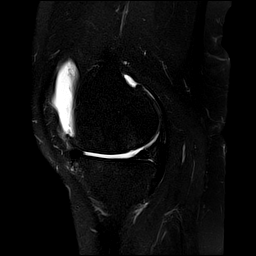
[im 23/28]
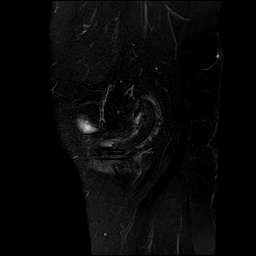
[im 28/28]
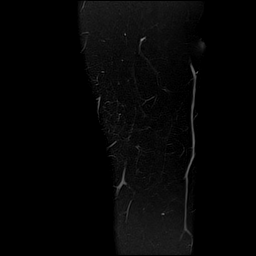

[Series 14: PD · coronal · left · 2.0mm · 0.47mm/px · 2 of 10 slices shown]
[im 1/10]
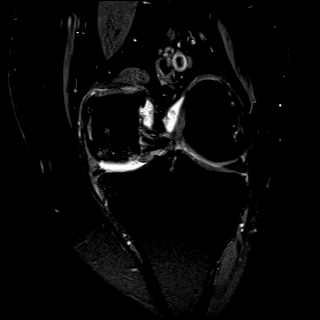
[im 10/10]
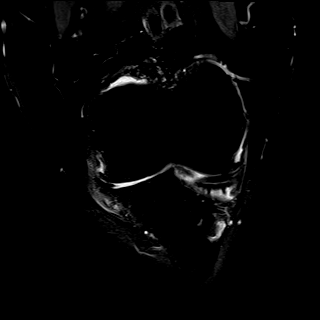

[40 of 40 positions shown; findings below may reference images not displayed]

FINDINGS: MENISCI

Medial meniscus: Horizontal tear in the midbody medial meniscus
extending to the free edge on image 14 series 11. This likewise
appears to extend up through the superior surface of the meniscus in
the adjacent posterior horn. Meniscal extrusion due to marginal
osteophytes.

Lateral meniscus:  Unremarkable

LIGAMENTS

Cruciates:  Unremarkable

Collaterals:  Unremarkable

CARTILAGE

Patellofemoral: Moderate degenerative chondral thinning, most
notable inferiorly along the patella for example on image 17 series
12. Marginal spurring.

Medial: Severe full-thickness loss of articular cartilage in much of
the medial compartment, with subcortical marrow edema and mild
cortical irregularity along the medial femoral condyle. Prominent
marginal spurring.

Lateral:  Marginal spurring noted.

Joint:  Small to moderate knee effusion.

Popliteal Fossa:  Unremarkable

Extensor Mechanism:  Unremarkable

Bones: No significant extra-articular osseous abnormalities
identified.

Other: No supplemental non-categorized findings.
IMPRESSION: 1. Mildly complex tearing in the midbody and adjacent posterior horn
medial meniscus with a notable horizontal component in the midbody,
extending through the superior surface segment in the adjacent
posterior horn.
2. Severe degenerative chondral thinning in the medial compartment.
Moderate chondral thinning in the patellofemoral joint.
Tricompartmental spurring.
3. Small to moderate knee effusion.

## 2020-11-27 ENCOUNTER — Ambulatory Visit: Payer: 59 | Admitting: Orthopaedic Surgery

## 2020-11-27 ENCOUNTER — Other Ambulatory Visit: Payer: Self-pay

## 2020-11-27 ENCOUNTER — Encounter: Payer: Self-pay | Admitting: Orthopaedic Surgery

## 2020-11-27 VITALS — Ht 65.5 in

## 2020-11-27 DIAGNOSIS — S83242D Other tear of medial meniscus, current injury, left knee, subsequent encounter: Secondary | ICD-10-CM | POA: Diagnosis not present

## 2020-11-27 NOTE — Progress Notes (Signed)
Patient Anne May, female DOB:Mar 15, 1966, 55 y.o. DVV:[id number]  Chief Complaint  Patient presents with  . Knee Pain    Left knee MRI. Patient reporting pain in right knee x 2 days as well.    HPI  Anne May is a 55 y.o. female who has continued pain of the left knee.  She had MRI which showed: IMPRESSION: 1. Mildly complex tearing in the midbody and adjacent posterior horn medial meniscus with a notable horizontal component in the midbody, extending through the superior surface segment in the adjacent posterior horn. 2. Severe degenerative chondral thinning in the medial compartment. Moderate chondral thinning in the patellofemoral joint. Tricompartmental spurring. 3. Small to moderate knee effusion.  I have informed her of the findings. I have recommended arthroscopy of the knee as out patient.  I will have her see Dr. Romeo Apple or Dr. Dallas Schimke.  She is concerned she lives alone.    I have independently reviewed the MRI.        Body mass index is 28.51 kg/m.  ROS  Review of Systems  Constitutional: Positive for activity change.  Endocrine:       Uncontrolled diabetes.  Musculoskeletal: Positive for arthralgias, gait problem and joint swelling.  All other systems reviewed and are negative.   All other systems reviewed and are negative.  The following is a summary of the past history medically, past history surgically, known current medicines, social history and family history.  This information is gathered electronically by the computer from prior information and documentation.  I review this each visit and have found including this information at this point in the chart is beneficial and informative.    History reviewed. No pertinent past medical history.  History reviewed. No pertinent surgical history.  Family History  Family history unknown: Yes    Social History Social History   Tobacco Use  . Smoking status: Current Every Day Smoker  . Smokeless  tobacco: Never Used  Substance Use Topics  . Alcohol use: Not Currently  . Drug use: Never    No Known Allergies  Current Outpatient Medications  Medication Sig Dispense Refill  . amLODipine (NORVASC) 5 MG tablet Take by mouth.    Marland Kitchen atorvastatin (LIPITOR) 20 MG tablet Take 20 mg by mouth daily.    . diphenhydrAMINE (BENADRYL) 25 mg capsule Take by mouth.    . famotidine (PEPCID) 20 MG tablet Take by mouth.    Marland Kitchen glipiZIDE (GLUCOTROL) 10 MG tablet Take 10 mg by mouth daily before breakfast.    . hydrochlorothiazide (HYDRODIURIL) 25 MG tablet Take 25 mg by mouth daily.    Marland Kitchen ibuprofen (ADVIL) 800 MG tablet Take 800 mg by mouth every 8 (eight) hours as needed.    Marland Kitchen lisinopril (ZESTRIL) 40 MG tablet Take 40 mg by mouth daily.    . metFORMIN (GLUCOPHAGE) 1000 MG tablet Take 1,000 mg by mouth 2 (two) times daily.     No current facility-administered medications for this visit.     Physical Exam  Height 5' 5.5" (1.664 m).  Constitutional: overall normal hygiene, normal nutrition, well developed, normal grooming, normal body habitus. Assistive device:left knee immobilizer  Musculoskeletal: gait and station Limp left, muscle tone and strength are normal, no tremors or atrophy is present.  .  Neurological: coordination overall normal.  Deep tendon reflex/nerve stretch intact.  Sensation normal.  Cranial nerves II-XII intact.   Skin:   Normal overall no scars, lesions, ulcers or rashes. No psoriasis.  Psychiatric: Alert and oriented  x 3.  Recent memory intact, remote memory unclear.  Normal mood and affect. Well groomed.  Good eye contact.  Cardiovascular: overall no swelling, no varicosities, no edema bilaterally, normal temperatures of the legs and arms, no clubbing, cyanosis and good capillary refill.  Lymphatic: palpation is normal.  Left knee is very tender, in knee immobilizer, limp to the left, NV intact.  I did not remove the immobilizer.    All other systems reviewed and are  negative   The patient has been educated about the nature of the problem(s) and counseled on treatment options.  The patient appeared to understand what I have discussed and is in agreement with it.  Encounter Diagnosis  Name Primary?  . Other tear of medial meniscus, current injury, left knee, subsequent encounter Yes    PLAN Call if any problems.  Precautions discussed.  Continue current medications.   Return to clinic to see Dr. Rexene Edison or Dr. Salena Saner. for arthroscopy of the knee.   Electronically Signed Darreld Mclean, MD 4/12/20229:21 AM

## 2020-12-11 ENCOUNTER — Encounter: Payer: Self-pay | Admitting: Orthopedic Surgery

## 2020-12-11 ENCOUNTER — Other Ambulatory Visit: Payer: Self-pay

## 2020-12-11 ENCOUNTER — Ambulatory Visit (INDEPENDENT_AMBULATORY_CARE_PROVIDER_SITE_OTHER): Payer: 59 | Admitting: Orthopedic Surgery

## 2020-12-11 VITALS — BP 147/91 | HR 101 | Ht 65.5 in | Wt 176.0 lb

## 2020-12-11 DIAGNOSIS — M1712 Unilateral primary osteoarthritis, left knee: Secondary | ICD-10-CM | POA: Diagnosis not present

## 2020-12-11 DIAGNOSIS — S83242D Other tear of medial meniscus, current injury, left knee, subsequent encounter: Secondary | ICD-10-CM

## 2020-12-12 ENCOUNTER — Encounter: Payer: Self-pay | Admitting: Orthopedic Surgery

## 2020-12-12 NOTE — Progress Notes (Signed)
New Patient Visit  Assessment: Anne May is a 55 y.o. female with the following: Moderate to severe left knee arthritis  Plan: Merrily Tegeler has severe arthritis in her left knee.  We reviewed the radiographs in clinic today, and I outlined the natural progression.  We had an extensive discussion regarding all potential treatment options, including continuing with the current treatment. NSAIDs are the most appropriate medications, and these are available OTC or via prescription.  I have urged them to remain active, and they can continue with activities on their own, or we can refer them to physical therapy.  We can also consider a brace, or compression sleeve. If the pain is severe enough, we can consider a steroid injection.  If their knee pain is affecting their everyday activities, including sleep, knee replacement is a consideration, but we would have to refer them to see my partner Dr. Romeo Apple.  After discussing all of these options, the patient has elected to proceed with physical therapy.  A referral was placed today.  She will also schedule a follow-up with Dr. Romeo Apple to discuss total knee arthroplasty in more detail.  We did discuss an injection today, but she is not interested at this time.    Follow-up: Return if symptoms worsen or fail to improve, for F/u with Dr. Romeo Apple to discuss knee replacement .  Subjective:  Chief Complaint  Patient presents with  . Knee Pain    Lt knee pain for 6-7 months. Has had MRI.    History of Present Illness: Anne May is a 55 y.o. female who presents for evaluation of her left knee.  She describes progressive worsening pain in her left knee, primarily within the medial aspect.  She has been wearing a brace, but notes that this does not fit well.  Otherwise, she has been taking medications.  She is frustrated given the course of her pain, and it is now limiting her ability to work.  She notes occasional swelling in her left knee.  No history  of an injury.  She has not had an injection.   Review of Systems: No fevers or chills No numbness or tingling No chest pain No shortness of breath No bowel or bladder dysfunction No GI distress No headaches   Medical History:  History reviewed. No pertinent past medical history.  History reviewed. No pertinent surgical history.  Family History  Family history unknown: Yes   Social History   Tobacco Use  . Smoking status: Current Every Day Smoker  . Smokeless tobacco: Never Used  Substance Use Topics  . Alcohol use: Not Currently  . Drug use: Never    No Known Allergies  No outpatient medications have been marked as taking for the 12/11/20 encounter (Office Visit) with Oliver Barre, MD.    Objective: BP (!) 147/91   Pulse (!) 101   Ht 5' 5.5" (1.664 m)   Wt 176 lb (79.8 kg)   BMI 28.84 kg/m   Physical Exam:  General: Alert and oriented.  No acute distress. Gait: Left-sided antalgic gait  Evaluation of the left knee demonstrates a mild varus alignment overall.  No evidence of effusion is appreciated on exam today.  Range of motion is 2-130 degrees.  Tenderness to palpation along the medial joint line.  Minimal crepitus is appreciated.  Negative Lachman.  No increased laxity to varus or valgus stress.    IMAGING: I personally reviewed images previously obtained in clinic   X-rays of the left knee demonstrates  severe, near complete loss of joint space within the medial compartment.  There is subchondral sclerosis and some osteophytes are present.  MRI of the left knee demonstrates full-thickness cartilage loss within the medial compartment.  Degenerative tear of the medial meniscus.  Osteophytes are noted, as well as some edema within the tibia.   New Medications:  No orders of the defined types were placed in this encounter.     Oliver Barre, MD  12/12/2020 1:16 PM

## 2020-12-28 NOTE — Progress Notes (Signed)
This encounter was created in error - please disregard.

## 2021-01-02 ENCOUNTER — Ambulatory Visit: Payer: 59 | Admitting: Orthopedic Surgery

## 2021-01-02 ENCOUNTER — Other Ambulatory Visit: Payer: Self-pay

## 2021-01-02 ENCOUNTER — Encounter: Payer: Self-pay | Admitting: Orthopedic Surgery

## 2021-01-02 VITALS — BP 179/105 | HR 118 | Ht 65.5 in | Wt 176.0 lb

## 2021-01-02 DIAGNOSIS — M1712 Unilateral primary osteoarthritis, left knee: Secondary | ICD-10-CM | POA: Diagnosis not present

## 2021-01-02 NOTE — Patient Instructions (Addendum)
Book appointment with your PCP for a Surgery Clearance and HgbA1c    Steps to Quit Smoking Smoking tobacco is the leading cause of preventable death. It can affect almost every organ in the body. Smoking puts you and people around you at risk for many serious, long-lasting (chronic) diseases. Quitting smoking can be hard, but it is one of the best things that you can do for your health. It is never too late to quit. How do I get ready to quit? When you decide to quit smoking, make a plan to help you succeed. Before you quit:  Pick a date to quit. Set a date within the next 2 weeks to give you time to prepare.  Write down the reasons why you are quitting. Keep this list in places where you will see it often.  Tell your family, friends, and co-workers that you are quitting. Their support is important.  Talk with your doctor about the choices that may help you quit.  Find out if your health insurance will pay for these treatments.  Know the people, places, things, and activities that make you want to smoke (triggers). Avoid them. What first steps can I take to quit smoking?  Throw away all cigarettes at home, at work, and in your car.  Throw away the things that you use when you smoke, such as ashtrays and lighters.  Clean your car. Make sure to empty the ashtray.  Clean your home, including curtains and carpets. What can I do to help me quit smoking? Talk with your doctor about taking medicines and seeing a counselor at the same time. You are more likely to succeed when you do both.  If you are pregnant or breastfeeding, talk with your doctor about counseling or other ways to quit smoking. Do not take medicine to help you quit smoking unless your doctor tells you to do so. To quit smoking: Quit right away  Quit smoking totally, instead of slowly cutting back on how much you smoke over a period of time.  Go to counseling. You are more likely to quit if you go to counseling sessions  regularly. Take medicine You may take medicines to help you quit. Some medicines need a prescription, and some you can buy over-the-counter. Some medicines may contain a drug called nicotine to replace the nicotine in cigarettes. Medicines may:  Help you to stop having the desire to smoke (cravings).  Help to stop the problems that come when you stop smoking (withdrawal symptoms). Your doctor may ask you to use:  Nicotine patches, gum, or lozenges.  Nicotine inhalers or sprays.  Non-nicotine medicine that is taken by mouth. Find resources Find resources and other ways to help you quit smoking and remain smoke-free after you quit. These resources are most helpful when you use them often. They include:  Online chats with a Veterinary surgeon.  Phone quitlines.  Printed Materials engineer.  Support groups or group counseling.  Text messaging programs.  Mobile phone apps. Use apps on your mobile phone or tablet that can help you stick to your quit plan. There are many free apps for mobile phones and tablets as well as websites. Examples include Quit Guide from the [employer] and smokefree.gov   What things can I do to make it easier to quit?  Talk to your family and friends. Ask them to support and encourage you.  Call a phone quitline (1-800-QUIT-NOW), reach out to support groups, or work with a Veterinary surgeon.  Ask people who smoke  to not smoke around you.  Avoid places that make you want to smoke, such as: ? Bars. ? Parties. ? Smoke-break areas at work.  Spend time with people who do not smoke.  Lower the stress in your life. Stress can make you want to smoke. Try these things to help your stress: ? Getting regular exercise. ? Doing deep-breathing exercises. ? Doing yoga. ? Meditating. ? Doing a body scan. To do this, close your eyes, focus on one area of your body at a time from head to toe. Notice which parts of your body are tense. Try to relax the muscles in those areas.   How will I  feel when I quit smoking? Day 1 to 3 weeks Within the first 24 hours, you may start to have some problems that come from quitting tobacco. These problems are very bad 2-3 days after you quit, but they do not often last for more than 2-3 weeks. You may get these symptoms:  Mood swings.  Feeling restless, nervous, angry, or annoyed.  Trouble concentrating.  Dizziness.  Strong desire for high-sugar foods and nicotine.  Weight gain.  Trouble pooping (constipation).  Feeling like you may vomit (nausea).  Coughing or a sore throat.  Changes in how the medicines that you take for other issues work in your body.  Depression.  Trouble sleeping (insomnia). Week 3 and afterward After the first 2-3 weeks of quitting, you may start to notice more positive results, such as:  Better sense of smell and taste.  Less coughing and sore throat.  Slower heart rate.  Lower blood pressure.  Clearer skin.  Better breathing.  Fewer sick days. Quitting smoking can be hard. Do not give up if you fail the first time. Some people need to try a few times before they succeed. Do your best to stick to your quit plan, and talk with your doctor if you have any questions or concerns. Summary  Smoking tobacco is the leading cause of preventable death. Quitting smoking can be hard, but it is one of the best things that you can do for your health.  When you decide to quit smoking, make a plan to help you succeed.  Quit smoking right away, not slowly over a period of time.  When you start quitting, seek help from your doctor, family, or friends. This information is not intended to replace advice given to you by your health care provider. Make sure you discuss any questions you have with your health care provider. Document Revised: 04/29/2019 Document Reviewed: 10/23/2018 Elsevier Patient Education  2021 [employer].

## 2021-01-02 NOTE — Progress Notes (Signed)
NEW PROBLEM//OFFICE VISIT  Summary assessment and plan: The patient is ready for left total knee  However, she does have her A1c checked and needs to see her medical doctor for preop clearance  She is diabetic and has some hypertension  The diabetes will put her at increased risk for infection which we discussed  Chief Complaint  Patient presents with  . Knee Pain    Left/ here to discuss surgery, states she has many questions and is very nervous   Dr Marney Doctor  55 year old female with chronic progressing knee pain primarily medial side of the left knee presents for possible total knee  Patient says she is tired of hurting her knee pain is getting worse her function is getting less and she has tried all nonoperative treatments and wishes to proceed with surgery  She was given an injection in the past she did take anti-inflammatories and also had physical therapy  She did get an MRI which showed cartilage loss on the end of the medial femoral condyle   Review of Systems  Respiratory: Negative for shortness of breath.   Cardiovascular: Negative for chest pain.  All other systems reviewed and are negative.    History reviewed. No pertinent past medical history.  History reviewed. No pertinent surgical history.  Family History  Family history unknown: Yes   Social History   Tobacco Use  . Smoking status: Current Every Day Smoker  . Smokeless tobacco: Never Used  Substance Use Topics  . Alcohol use: Not Currently  . Drug use: Never    No Known Allergies  Current Meds  Medication Sig  . amLODipine (NORVASC) 5 MG tablet Take by mouth.  Marland Kitchen atorvastatin (LIPITOR) 20 MG tablet Take 20 mg by mouth daily.  . diphenhydrAMINE (BENADRYL) 25 mg capsule Take by mouth.  . famotidine (PEPCID) 20 MG tablet Take by mouth.  Marland Kitchen glipiZIDE (GLUCOTROL) 10 MG tablet Take 10 mg by mouth daily before breakfast.  . hydrochlorothiazide (HYDRODIURIL) 25 MG tablet Take 25 mg by mouth daily.  Marland Kitchen  ibuprofen (ADVIL) 800 MG tablet Take 800 mg by mouth every 8 (eight) hours as needed.  . insulin detemir (LEVEMIR FLEXTOUCH) 100 UNIT/ML FlexPen Inject into the skin.  Marland Kitchen lisinopril (ZESTRIL) 40 MG tablet Take 40 mg by mouth daily.  . metFORMIN (GLUCOPHAGE) 1000 MG tablet Take 1,000 mg by mouth 2 (two) times daily.    BP (!) 179/105   Pulse (!) 118   Ht 5' 5.5" (1.664 m)   Wt 176 lb (79.8 kg)   BMI 28.84 kg/m   Physical Exam  General appearance: Well-developed well-nourished no gross deformities  Cardiovascular normal pulse and perfusion normal color without edema  Neurologically o sensation loss or deficits or pathologic reflexes  Psychological: Awake alert and oriented x3 mood and affect normal  Skin no lacerations or ulcerations no nodularity no palpable masses, no erythema or nodularity  Musculoskeletal:   The patient is very sensitive to touch around the knee which could portend for a difficult postop course  Shows good flexion of the knee but poor extension in the last 5 degrees the knee is otherwise stable muscle tone is normal     MEDICAL DECISION MAKING  A. No diagnosis found.  B. DATA ANALYSED:   IMAGING: Interpretation of images: X-rays in the MRI show varus knee mild less than 10 degrees medial side is completely worn through bone-on-bone   Orders: Recommend medical consult for A1c  Outside records reviewed: None other than office  records   C. MANAGEMENT   Patient will be reminded to stop smoking prior to the surgery  She needs her A1c checked and needs to be less than 8  The procedure has been fully reviewed with the patient; The risks and benefits of surgery have been discussed and explained and understood. Alternative treatment has also been reviewed, questions were encouraged and answered. The postoperative plan is also been reviewed.  Left total knee  No orders of the defined types were placed in this encounter.     Fuller Canada,  MD  01/02/2021 8:48 AM

## 2022-04-01 ENCOUNTER — Ambulatory Visit: Payer: 59 | Admitting: Orthopaedic Surgery

## 2022-04-02 ENCOUNTER — Ambulatory Visit: Payer: 59 | Admitting: Orthopaedic Surgery

## 2022-04-02 ENCOUNTER — Ambulatory Visit (INDEPENDENT_AMBULATORY_CARE_PROVIDER_SITE_OTHER): Payer: 59

## 2022-04-02 ENCOUNTER — Encounter: Payer: Self-pay | Admitting: Orthopaedic Surgery

## 2022-04-02 VITALS — BP 159/100 | HR 102 | Ht 66.0 in | Wt 185.6 lb

## 2022-04-02 DIAGNOSIS — M1712 Unilateral primary osteoarthritis, left knee: Secondary | ICD-10-CM

## 2022-04-02 DIAGNOSIS — G8929 Other chronic pain: Secondary | ICD-10-CM

## 2022-04-02 NOTE — Progress Notes (Signed)
I need to talk about my knee.  She has pain in the left knee for some time now.  She has medial pain.  She needs surgery but her diabetes has been out of control.  Her A1C was 11, now 8 but her doctor wants it be be lower and better controlled.  She is meeting with nutritionist and trying hard to modify her diet.  It is hard.  She wants to know how bad her knee is on X-rays now.  She has no new trauma.  She limps. She has swelling.  Left knee has crepitus, medial pain, slight effusion, ROM 0 to 105, limp right, positive medial McMurray, NV intact, no distal edema.  X-rays were done of the left knee, reported separately.  Encounter Diagnoses  Name Primary?   Arthritis of left knee Yes   Chronic pain of left knee    I have encouraged her to continue her diet plan.  I have suggested various foods in the supermarket to get that are better for her diet.  Return prn.  We can work out surgery later once her diabetes stabilizes. Call if any problem.  Precautions discussed.  Electronically Signed Darreld Mclean, MD 8/16/202311:18 AM

## 2022-10-16 ENCOUNTER — Encounter: Payer: Self-pay | Admitting: Radiology

## 2022-11-04 ENCOUNTER — Ambulatory Visit: Payer: 59 | Admitting: Orthopaedic Surgery

## 2022-11-04 DIAGNOSIS — F17210 Nicotine dependence, cigarettes, uncomplicated: Secondary | ICD-10-CM | POA: Insufficient documentation

## 2022-11-11 ENCOUNTER — Ambulatory Visit (INDEPENDENT_AMBULATORY_CARE_PROVIDER_SITE_OTHER): Payer: 59 | Admitting: Orthopaedic Surgery

## 2022-11-11 ENCOUNTER — Encounter: Payer: Self-pay | Admitting: Orthopaedic Surgery

## 2022-11-11 ENCOUNTER — Other Ambulatory Visit (INDEPENDENT_AMBULATORY_CARE_PROVIDER_SITE_OTHER): Payer: 59

## 2022-11-11 VITALS — BP 144/86 | HR 115 | Ht 66.0 in | Wt 190.0 lb

## 2022-11-11 DIAGNOSIS — M25551 Pain in right hip: Secondary | ICD-10-CM

## 2022-11-11 DIAGNOSIS — M25562 Pain in left knee: Secondary | ICD-10-CM | POA: Diagnosis not present

## 2022-11-11 DIAGNOSIS — G8929 Other chronic pain: Secondary | ICD-10-CM

## 2022-11-11 DIAGNOSIS — S83242D Other tear of medial meniscus, current injury, left knee, subsequent encounter: Secondary | ICD-10-CM | POA: Diagnosis not present

## 2022-11-11 NOTE — Progress Notes (Signed)
I have left knee and right hip pain.  She has recurrence of left knee pain.  She has swelling and giving way.  I saw her for this late summer and early fall.  It has gotten worse.  The giving way is a problem.  Nothing seems to help.  I will get a MRI.  She has no new trauma.  She has had some pain in the right hip with no trauma.  It hurts to stand but she gets less pain after walking. She has no new trauma.  She is a diabetic.  In the summer her A1C was around 8, recent one is 10.5.  She is not eating correctly by her own admission.  I spent some time with her and the  man accompanying her about a proper diet.  X-rays were done of the pelvis and right hip, reported separately.  She has effusion of the left knee, crepitus, medial joint line pain, positive medial McMurray, limp right, NV intact, no distal edema.  Right hip has internal 20, external 20, flexion 90, abduction 30, adduction 20, extension 5.  NV intact.  Encounter Diagnoses  Name Primary?   Other tear of medial meniscus, current injury, left knee, subsequent encounter Yes   Chronic pain of left knee    Chronic hip pain, right    I will get MRI of the left knee.  She left the office after the X-rays saying she could only handle so much at one time.  Return in three weeks.  Call if any problem.  Precautions discussed.  Electronically Signed Sanjuana Kava, MD 3/26/20249:54 PM

## 2022-11-11 NOTE — Patient Instructions (Addendum)
DR.KEELING'S SCHEDULE IS AS FOLLOWS: TUESDAY: ALL DAY WEDNESDAY: MORNING ONLY THURSDAY: MORNING ONLY PLEASE CALL OR SEND A MESSAGE VIA MYCHART BY WEDNESDAY MORNING SO THAT I CAN SEND A REQUEST TO HIM. HE LEAVES THURSDAY BY 11:30AM. HE DOES NOT RETURN TO THE OFFICE UNTIL TUESDAY MORNINGS AND HE DOES NOT CHECK HIS WORK MESSAGES DURING HIS TIME AWAY FROM THE OFFICE.   Having an elevated A1C can cause increased joint pain.   You will have your imaging done at [facility]. Call and schedule your appointment within one week. We will work on Therapist, occupational.   [facility] [address] [address] PHONE: [phone]   Read the diabetic handouts given to you today in clinic. It's a lot of useful information.

## 2022-11-19 ENCOUNTER — Encounter: Payer: Self-pay | Admitting: Orthopaedic Surgery

## 2022-11-22 ENCOUNTER — Ambulatory Visit
Admission: RE | Admit: 2022-11-22 | Discharge: 2022-11-22 | Disposition: A | Discharge status: Home / Self Care | Payer: 59 | Source: Ambulatory Visit | Attending: Orthopaedic Surgery | Admitting: Orthopaedic Surgery

## 2022-11-22 DIAGNOSIS — S83242D Other tear of medial meniscus, current injury, left knee, subsequent encounter: Secondary | ICD-10-CM

## 2022-11-22 DIAGNOSIS — G8929 Other chronic pain: Secondary | ICD-10-CM

## 2023-01-06 ENCOUNTER — Ambulatory Visit: Payer: 59 | Admitting: Orthopaedic Surgery

## 2023-01-20 ENCOUNTER — Encounter: Payer: Self-pay | Admitting: Orthopaedic Surgery

## 2023-01-20 ENCOUNTER — Ambulatory Visit: Payer: 59 | Admitting: Orthopaedic Surgery

## 2023-01-20 VITALS — BP 152/94 | HR 119 | Ht 66.0 in | Wt 190.0 lb

## 2023-01-20 DIAGNOSIS — M25562 Pain in left knee: Secondary | ICD-10-CM

## 2023-01-20 DIAGNOSIS — G8929 Other chronic pain: Secondary | ICD-10-CM

## 2023-01-20 DIAGNOSIS — S83242D Other tear of medial meniscus, current injury, left knee, subsequent encounter: Secondary | ICD-10-CM | POA: Diagnosis not present

## 2023-01-20 MED ORDER — METHYLPREDNISOLONE ACETATE 40 MG/ML IJ SUSP
40.0000 mg | Freq: Once | INTRAMUSCULAR | Status: AC
  Administered 2023-01-20: 40 mg via INTRA_ARTICULAR

## 2023-01-20 NOTE — Progress Notes (Signed)
My knee gives way and hurts.  She has buckling of the left knee and pain.  She has had MRI of the knee done on 11-25-22 which showed: IMPRESSION: Complex degenerative tearing of the medial meniscal body. Free edge and upper surface fraying of the posterior horn with probable small upper surface tear.   Tricompartment osteoarthritis, worst in the medial compartment with full-thickness cartilage loss along the weight-bearing surfaces. Slight progression of lateral compartment chondrosis in comparison prior exam in April 2022.   Moderate-sized joint effusion. Tiny Baker's cyst.  I saw her several years ago for this.  She had MRI then and the tear is worse as is the arthritis.  She now wants something done.  She would prefer arthroscopy of the knee than a total knee.  She saw Dr. Romeo Apple for this two years ago. He recommended a total knee.  She has diabetes and her A1C is in the 8 range. She will not tell me the last number.  I will have her see Dr. Romeo Apple again and get his opinion as to surgery.  She is agreeable to this.  Left knee has effusion, crepitus, ROM 0 to 105, medial pain, medial positive McMurray, limp left, NV intact, no distal edema.  Encounter Diagnoses  Name Primary?   Chronic pain of left knee Yes   Other tear of medial meniscus, current injury, left knee, subsequent encounter    PROCEDURE NOTE:  The patient requests injections of the left knee , verbal consent was obtained.  The left knee was prepped appropriately after time out was performed.   Sterile technique was observed and injection of 1 cc of DepoMedrol 40 mg with several cc's of plain xylocaine. Anesthesia was provided by ethyl chloride and a 20-gauge needle was used to inject the knee area. The injection was tolerated well.  A band aid dressing was applied.  The patient was advised to apply ice later today and tomorrow to the injection sight as needed.  To see Dr. Romeo Apple  Call if any  problem.  Precautions discussed.  Electronically Signed Darreld Mclean, MD 6/4/20242:17 PM

## 2023-02-02 ENCOUNTER — Ambulatory Visit: Payer: 59 | Admitting: Orthopedic Surgery

## 2023-02-02 ENCOUNTER — Encounter: Payer: Self-pay | Admitting: Orthopedic Surgery

## 2023-02-02 VITALS — BP 141/84 | HR 109 | Ht 66.0 in | Wt 190.0 lb

## 2023-02-02 DIAGNOSIS — M1712 Unilateral primary osteoarthritis, left knee: Secondary | ICD-10-CM

## 2023-02-02 DIAGNOSIS — S83242D Other tear of medial meniscus, current injury, left knee, subsequent encounter: Secondary | ICD-10-CM

## 2023-02-02 NOTE — Progress Notes (Unsigned)
Chief Complaint  Patient presents with   Knee Pain    Pain left knee ref from Dr Hilda Lias for possible surgery     57 year old female with diabetes hypertension smoking history not ready for knee replacement surgery  She is having trouble with her diet her hemoglobin A1c was 10 about 2 to 3 weeks ago  She smokes 2 packs of cigarettes per week   Encounter Diagnoses  Name Primary?   Other tear of medial meniscus, current injury, left knee, subsequent encounter Yes   Arthritis of left knee     I talked to her about hitting her hemoglobin A1c under 8 stopping her smoking habit and see me in 3 months for x-rays back and knee

## 2023-02-17 ENCOUNTER — Encounter: Payer: 59 | Admitting: Orthopaedic Surgery

## 2023-04-06 ENCOUNTER — Ambulatory Visit: Payer: 59 | Admitting: Orthopedic Surgery

## 2024-03-02 ENCOUNTER — Ambulatory Visit: Admitting: Orthopaedic Surgery

## 2024-03-02 ENCOUNTER — Encounter: Payer: Self-pay | Admitting: Orthopaedic Surgery

## 2024-03-02 VITALS — Ht 66.0 in | Wt 189.0 lb

## 2024-03-02 DIAGNOSIS — M25562 Pain in left knee: Secondary | ICD-10-CM

## 2024-03-02 DIAGNOSIS — F17210 Nicotine dependence, cigarettes, uncomplicated: Secondary | ICD-10-CM

## 2024-03-02 DIAGNOSIS — S83242D Other tear of medial meniscus, current injury, left knee, subsequent encounter: Secondary | ICD-10-CM

## 2024-03-02 DIAGNOSIS — G8929 Other chronic pain: Secondary | ICD-10-CM | POA: Diagnosis not present

## 2024-03-02 NOTE — Patient Instructions (Signed)
Smoking Tobacco Information, Adult Smoking tobacco can be harmful to your health. Tobacco contains a toxic colorless chemical called nicotine. Nicotine causes changes in your brain that make you want more and more. This is called addiction. This can make it hard to stop smoking once you start. Tobacco also has other toxic chemicals that can hurt your body and raise your risk of many cancers. Menthol or "lite" tobacco or cigarette brands are not safer than regular brands. How can smoking tobacco affect me? Smoking tobacco puts you at risk for: Cancer. Smoking is most commonly associated with lung cancer, but can also lead to cancer in other parts of the body. Chronic obstructive pulmonary disease (COPD). This is a long-term lung condition that makes it hard to breathe. It also gets worse over time. High blood pressure (hypertension), heart disease, stroke, heart attack, and lung infections, such as pneumonia. Cataracts. This is when the lenses in the eyes become clouded. Digestive problems. This may include peptic ulcers, heartburn, and gastroesophageal reflux disease (GERD). Oral health problems, such as gum disease, mouth sores, and tooth loss. Loss of taste and smell. Smoking also affects how you look and smell. Smoking may cause: Wrinkles. Yellow or stained teeth, fingers, and fingernails. Bad breath. Bad-smelling clothes and hair. Smoking tobacco can also affect your social life, because: It may be challenging to find places to smoke when away from home. Many workplaces, restaurants, hotels, and public places are tobacco-free. Smoking is expensive. This is due to the cost of tobacco and the long-term costs of treating health problems from smoking. Secondhand smoke may affect those around you. Secondhand smoke can cause lung cancer, breathing problems, and heart disease. Children of smokers have a higher risk for: Sudden infant death syndrome (SIDS). Ear infections. Lung infections. What  actions can I take to prevent health problems? Quit smoking  Do not start smoking. Quit if you already smoke. Do not replace cigarette smoking with vaping devices, such as e-cigarettes. Make a plan to quit smoking and commit to it. Look for programs to help you, and ask your health care provider for recommendations and ideas. Set a date and write down all the reasons you want to quit. Let your friends and family know you are quitting so they can help and support you. Consider finding friends who also want to quit. It can be easier to quit with someone else, so that you can support each other. Talk with your health care provider about using nicotine replacement medicines to help you quit. These include gum, lozenges, patches, sprays, or pills. If you try to quit but return to smoking, stay positive. It is common to slip up when you first quit, so take it one day at a time. Be prepared for cravings. When you feel the urge to smoke, chew gum or suck on hard candy. Lifestyle Stay busy. Take care of your body. Get plenty of exercise, eat a healthy diet, and drink plenty of water. Find ways to manage your stress, such as meditation, yoga, exercise, or time spent with friends and family. Ask your health care provider about having regular tests (screenings) to check for cancer. This may include blood tests, imaging tests, and other tests. Where to find support To get support to quit smoking, consider: Asking your health care provider for more information and resources. Joining a support group for people who want to quit smoking in your local community. There are many effective programs that may help you to quit. Calling the smokefree.gov counselor   helpline at 1-800-QUIT-NOW (1-800-784-8669). Where to find more information You may find more information about quitting smoking from: Centers for Disease Control and Prevention: cdc.gov/tobacco Smokefree.gov: smokefree.gov American Lung Association:  freedomfromsmoking.org Contact a health care provider if: You have problems breathing. Your lips, nose, or fingers turn blue. You have chest pain. You are coughing up blood. You feel like you will faint. You have other health changes that cause you to worry. Summary Smoking tobacco can negatively affect your health, the health of those around you, your finances, and your social life. Do not start smoking. Quit if you already smoke. If you need help quitting, ask your health care provider. Consider joining a support group for people in your local community who want to quit smoking. There are many effective programs that may help you to quit. This information is not intended to replace advice given to you by your health care provider. Make sure you discuss any questions you have with your health care provider. Document Revised: 07/30/2021 Document Reviewed: 07/30/2021 Elsevier Patient Education  2024 Elsevier Inc.  

## 2024-03-02 NOTE — Progress Notes (Signed)
 My knee is worse.  She has a known tear of the medial meniscus of the left knee as well as tricompartmental degenerative changes.  MRI was done in April, 2024.  She delayed any consideration of surgery then as her A1C was over 8.  It is now 7 and below.  Her diabetes is well controlled now.  She would like to have surgery.  I will have her see Dr. Margrette for consideration of surgery.  Left knee has effusion, crepitus, medial pain, ROM 0 to 105, positive medial McMurray, NV intact, no distal edema.  Encounter Diagnoses  Name Primary?   Other tear of medial meniscus, current injury, left knee, subsequent encounter Yes   Chronic pain of left knee    To see Dr. Margrette.  Call if any problem.  Precautions discussed.  Electronically Signed Lemond Stable, MD 7/16/20252:07 PM

## 2024-03-21 ENCOUNTER — Telehealth: Payer: Self-pay | Admitting: Orthopaedic Surgery

## 2024-03-21 NOTE — Telephone Encounter (Signed)
 03/02/24 ov note faxed to [facility]/referring office to [phone]

## 2024-03-28 DIAGNOSIS — F411 Generalized anxiety disorder: Secondary | ICD-10-CM | POA: Insufficient documentation

## 2024-03-30 ENCOUNTER — Encounter: Payer: Self-pay | Admitting: Orthopedic Surgery

## 2024-03-30 ENCOUNTER — Other Ambulatory Visit (INDEPENDENT_AMBULATORY_CARE_PROVIDER_SITE_OTHER)

## 2024-03-30 ENCOUNTER — Ambulatory Visit: Admitting: Orthopedic Surgery

## 2024-03-30 VITALS — Ht 66.0 in | Wt 189.0 lb

## 2024-03-30 DIAGNOSIS — M4316 Spondylolisthesis, lumbar region: Secondary | ICD-10-CM

## 2024-03-30 DIAGNOSIS — M1712 Unilateral primary osteoarthritis, left knee: Secondary | ICD-10-CM | POA: Diagnosis not present

## 2024-03-30 DIAGNOSIS — M47816 Spondylosis without myelopathy or radiculopathy, lumbar region: Secondary | ICD-10-CM | POA: Diagnosis not present

## 2024-03-30 DIAGNOSIS — M545 Low back pain, unspecified: Secondary | ICD-10-CM

## 2024-03-30 NOTE — Progress Notes (Signed)
 Chief Complaint  Patient presents with   Knee Pain    Left/ states A1C is better, doesn't remember what it is, wants to discuss surgery again, has not been able to quit smoking     History 58 year old female presents for reevaluation for possible surgical intervention for issues with her left knee  Her complaints are Left knee pain for 2 to 3 years.  I saw her in the past and her was an issue she is currently on Mounjaro  She complains that her knee gives out and she falls a lot she has had physical therapy and injection without good relief.  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  She wants to return to her mother's grave site who died at childbirth on Oct 15, 2025she also has a Muslim rodeo that she wants to attend on August 23 in [place]   Review of systems she has some what she calls hip pain but points to her lower back  Exam today reveals that she has full range of motion and no swelling or joint effusion she has good strength in quadriceps and extensor mechanism but she has tenderness over the medial compartment and the patellofemoral joint.  Hip range of motion normal neurovascular exam intact  Imaging studies of back and knee were obtained  DG Knee AP/LAT W/Sunrise Left Result Date: 03/30/2024 Left knee images left knee pain left knee arthritis surgical prep X-ray shows Alignment is varus mild to moderate Joint space narrowing severe medial compartment Secondary bone changes osteophytes medial margin tibia and femur Impression severe arthritis with mild to moderate varus left knee   DG Lumbar Spine 2-3 Views Result Date: 03/30/2024 Patient with left-sided hip pain lower back pain and frequent falls and giving way left leg Spine images x 3 X-ray shows slight coronal plane shift no scoliosis.  L4 on 5 grade 1 spondylolisthesis Mild facet arthritis mild narrowing of the L5-S1 disc space Impression spondylolisthesis grade 1 mild  spondylosis L-spine    Assessment and plan  Encounter Diagnoses  Name Primary?   Arthritis of left knee Yes   Spondylolisthesis, lumbar region    Facet arthritis of lumbar region     58 year old female smoker still smoking she also sees a psychiatrist, need more information: With diabetes-need A1c.  Need medical consult.  I told her we need to do the following things to get her set up for surgery  1 we need her hemoglobin A1c  2 we need her to stop smoking for 30 days  3 we need a medical consult  4 she needs to go to physical therapy for her spondylolisthesis Follow-up in 6 weeks

## 2024-03-30 NOTE — Patient Instructions (Addendum)
 Physical therapy has been ordered for you at [facility] call them to schedule [phone]

## 2024-03-30 NOTE — Progress Notes (Signed)
   Ht 5' 6 (1.676 m)   Wt 189 lb (85.7 kg)   BMI 30.51 kg/m   Body mass index is 30.51 kg/m.  Chief Complaint  Patient presents with   Knee Pain    Left/ states A1C is better, doesn't remember what it is, wants to discuss surgery again, has not been able to quit smoking     No diagnosis found.  DOI/DOS/ Date: ongoing   Unchanged

## 2024-04-08 ENCOUNTER — Encounter: Payer: Self-pay | Admitting: Radiology

## 2024-04-25 ENCOUNTER — Telehealth: Payer: Self-pay | Admitting: Orthopedic Surgery

## 2024-04-25 NOTE — Telephone Encounter (Signed)
 Returned the pt's call, lvm for her that I didn't understand her message to please call back.

## 2024-04-26 ENCOUNTER — Telehealth: Payer: Self-pay | Admitting: Orthopedic Surgery

## 2024-04-26 NOTE — Telephone Encounter (Signed)
 Dr. Areatha pt - spoke w/the pt, she stated PT told her to call here and request Dexamethasone for Iontophoresis and to have it sent to [facility] in [place].  [phone]

## 2024-04-27 NOTE — Telephone Encounter (Signed)
 Lm for her to call me back So I can find out where she is going for the PT and what strength and frequency ext she needs

## 2024-04-27 NOTE — Telephone Encounter (Signed)
 I was hoping you knew it, I looked up and could not find it  I will let her know.

## 2024-05-03 NOTE — Telephone Encounter (Signed)
 She is no longer going to physical therapy there. The P T she was going to is out of network so she will no longer be goinig she will find someone else and let me know where to send the order.

## 2024-05-12 ENCOUNTER — Ambulatory Visit: Admitting: Orthopedic Surgery

## 2024-05-25 ENCOUNTER — Ambulatory Visit: Admitting: Orthopedic Surgery

## 2024-05-25 ENCOUNTER — Encounter: Payer: Self-pay | Admitting: Orthopedic Surgery

## 2024-05-25 DIAGNOSIS — S83242D Other tear of medial meniscus, current injury, left knee, subsequent encounter: Secondary | ICD-10-CM

## 2024-05-25 DIAGNOSIS — M1712 Unilateral primary osteoarthritis, left knee: Secondary | ICD-10-CM

## 2024-05-25 DIAGNOSIS — G8929 Other chronic pain: Secondary | ICD-10-CM

## 2024-05-25 NOTE — Progress Notes (Signed)
    05/25/2024   Chief Complaint  Patient presents with   Knee Pain    Left     Encounter Diagnoses  Name Primary?   Arthritis of left knee Yes   Chronic pain of left knee    Other tear of medial meniscus, current injury, left knee, subsequent encounter     What pharmacy do you use ? _____[facility]______  DOI/DOS/ Date:    Unchanged  Went to PT in [place] but they didn't accept her insurance (she self scheduled it)  wants to go to [facility] they take her insurance (order sent today)

## 2024-05-25 NOTE — Progress Notes (Signed)
    05/25/2024   Chief Complaint  Patient presents with   Knee Pain    Left     Encounter Diagnoses  Name Primary?   Arthritis of left knee Yes   Chronic pain of left knee    Other tear of medial meniscus, current injury, left knee, subsequent encounter     What pharmacy do you use ? _____[facility]______  DOI/DOS/ Date:    Unchanged  Went to PT in [place] but they didn't accept her insurance (she self scheduled it)  wants to go to [facility] they take her insurance (order sent today)   67 female with end-stage arthritis of the left knee was unable to go to physical therapy.  She is not ready for surgery at this time.  She has had difficulty stopping smoking.  She is also had some issues with her husband and who is going to care for her after surgery which she expressed today  I think it is prudent to wait on proceeding with surgical intervention at this time  We will reassess her situation in December

## 2024-06-20 ENCOUNTER — Encounter: Payer: Self-pay | Admitting: Radiology

## 2024-08-04 ENCOUNTER — Ambulatory Visit: Admitting: Orthopedic Surgery

## 2024-08-04 DIAGNOSIS — M4316 Spondylolisthesis, lumbar region: Secondary | ICD-10-CM | POA: Diagnosis not present

## 2024-08-04 DIAGNOSIS — F17210 Nicotine dependence, cigarettes, uncomplicated: Secondary | ICD-10-CM | POA: Diagnosis not present

## 2024-08-04 DIAGNOSIS — M1712 Unilateral primary osteoarthritis, left knee: Secondary | ICD-10-CM | POA: Diagnosis not present

## 2024-08-04 NOTE — Progress Notes (Signed)
° ° °  08/04/2024   Chief Complaint  Patient presents with   Knee Pain    left    No diagnosis found.  What pharmacy do you use ? [facility]________  DOI/DOS/ Date:    Did you get better, worse or no change (Answer below)   Worse, states ready for surgery, also states she has fallenx2 her knee gives out

## 2024-08-04 NOTE — Progress Notes (Signed)
° °  Chief Complaint  Patient presents with   Knee Pain    left    58 year old female with chronic knee pain from osteoarthritis and chronic instability in the left leg secondary to lumbar spine disease  The patient did undergo physical therapy for her spine and did get some relief in her back pain but her left knee continues to give way  We have discussed that some of this is coming from her back issue  As far as the knee goes she needs to stop smoking she will need a nicotine test.  Her hemoglobin A1c levels need to be checked with primary care reportedly they are in the 6-7 range  I will also need a medical consult to clear her for surgery.  Her mental status/psychiatric history needs to be investigated as well  Knee Pain     PHYSICAL EXAM:  The knee feels stable although there is an effusion she does have pain and tenderness along the medial and lateral joint lines she resists range of motion testing.  She does have some lower back pain and tenderness     Assessment and Plan:   Encounter Diagnoses  Name Primary?   Nicotine dependence, cigarettes, uncomplicated Yes   Spondylolisthesis, lumbar region    Arthritis of left knee      Rule out anxiety  Osteoarthritis left knee  Lumbar disc disease with left leg weakness  Diabetes  Chronic nicotine use

## 2024-09-05 ENCOUNTER — Ambulatory Visit: Admitting: Orthopedic Surgery

## 2024-09-07 ENCOUNTER — Encounter: Payer: Self-pay | Admitting: Orthopedic Surgery

## 2024-09-07 ENCOUNTER — Ambulatory Visit: Admitting: Orthopedic Surgery

## 2024-09-07 DIAGNOSIS — F17210 Nicotine dependence, cigarettes, uncomplicated: Secondary | ICD-10-CM | POA: Diagnosis not present

## 2024-09-07 DIAGNOSIS — M1712 Unilateral primary osteoarthritis, left knee: Secondary | ICD-10-CM

## 2024-09-07 DIAGNOSIS — R2681 Unsteadiness on feet: Secondary | ICD-10-CM | POA: Insufficient documentation

## 2024-09-07 DIAGNOSIS — G8929 Other chronic pain: Secondary | ICD-10-CM

## 2024-09-07 DIAGNOSIS — M4316 Spondylolisthesis, lumbar region: Secondary | ICD-10-CM

## 2024-09-07 DIAGNOSIS — R296 Repeated falls: Secondary | ICD-10-CM | POA: Insufficient documentation

## 2024-09-07 NOTE — Progress Notes (Signed)
" ° ° °  09/07/2024   Chief Complaint  Patient presents with   Knee Pain    left    No diagnosis found.  What pharmacy do you use ? _____[facility]______  DOI/DOS/ Date:    Did you get better, worse or no change (Answer below)   Unchanged not ready for surgery Has the flu today       "

## 2024-09-07 NOTE — Progress Notes (Signed)
" ° °  Patient: Anne May           Date of Birth: Nov 17, 1965           MRN: [id number] Visit Date: 09/07/2024 Requested by: Lonna Millman, DO [address] [place],  [place] [address] PCP: Lonna Millman, DO  Encounter Diagnoses  Name Primary?   Chronic pain of left knee Yes   Arthritis of left knee    Spondylolisthesis, lumbar region    Nicotine dependence, cigarettes, uncomplicated     Chief Complaint  Patient presents with   Knee Pain    left     Assessment and plan:  59 year old female considering knee replacement but is having difficulty with nicotine addiction  Recommend see primary care for nicotine patch recheck after Ramadan in March at that time we will do a nicotine test  "

## 2024-11-07 ENCOUNTER — Ambulatory Visit: Admitting: Orthopedic Surgery
